# Patient Record
Sex: Female | Born: 1970
Health system: Southern US, Community
[De-identification: ages and names within clinical notes are randomized; demographics above are authoritative.]

## PROBLEM LIST (undated history)

## (undated) DIAGNOSIS — M545 Low back pain, unspecified: Secondary | ICD-10-CM

## (undated) DIAGNOSIS — N809 Endometriosis, unspecified: Secondary | ICD-10-CM

## (undated) DIAGNOSIS — F419 Anxiety disorder, unspecified: Secondary | ICD-10-CM

## (undated) DIAGNOSIS — F32A Depression, unspecified: Secondary | ICD-10-CM

## (undated) DIAGNOSIS — G8929 Other chronic pain: Secondary | ICD-10-CM

## (undated) DIAGNOSIS — G43009 Migraine without aura, not intractable, without status migrainosus: Secondary | ICD-10-CM

## (undated) DIAGNOSIS — F329 Major depressive disorder, single episode, unspecified: Secondary | ICD-10-CM

## (undated) DIAGNOSIS — K219 Gastro-esophageal reflux disease without esophagitis: Secondary | ICD-10-CM

## (undated) HISTORY — DX: Other chronic pain: G89.29

## (undated) HISTORY — PX: WISDOM TOOTH EXTRACTION: SHX21

## (undated) HISTORY — DX: Low back pain, unspecified: M54.50

## (undated) HISTORY — DX: Gastro-esophageal reflux disease without esophagitis: K21.9

## (undated) HISTORY — DX: Depression, unspecified: F32.A

## (undated) HISTORY — DX: Endometriosis, unspecified: N80.9

## (undated) HISTORY — DX: Anxiety disorder, unspecified: F41.9

## (undated) HISTORY — DX: Major depressive disorder, single episode, unspecified: F32.9

## (undated) HISTORY — DX: Migraine without aura, not intractable, without status migrainosus: G43.009

## (undated) HISTORY — DX: Low back pain: M54.5

---
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
## 1993-08-13 HISTORY — PX: TMJ ARTHROSCOPY: SHX1067

## 1994-08-13 HISTORY — PX: TMJ ARTHROSCOPY: SHX1067

## 2003-08-14 HISTORY — PX: APPENDECTOMY: SHX54

## 2003-08-14 HISTORY — PX: OTHER SURGICAL HISTORY: SHX169

## 2010-01-24 DIAGNOSIS — M546 Pain in thoracic spine: Secondary | ICD-10-CM | POA: Insufficient documentation

## 2012-10-03 ENCOUNTER — Other Ambulatory Visit: Payer: Self-pay | Admitting: Obstetrics and Gynecology

## 2012-10-03 DIAGNOSIS — Z1231 Encounter for screening mammogram for malignant neoplasm of breast: Secondary | ICD-10-CM

## 2012-10-23 ENCOUNTER — Ambulatory Visit
Admission: RE | Admit: 2012-10-23 | Discharge: 2012-10-23 | Disposition: A | Discharge status: Home / Self Care | Payer: 59 | Source: Ambulatory Visit | Attending: Obstetrics and Gynecology | Admitting: Obstetrics and Gynecology

## 2012-10-23 DIAGNOSIS — Z1231 Encounter for screening mammogram for malignant neoplasm of breast: Secondary | ICD-10-CM

## 2013-07-20 ENCOUNTER — Ambulatory Visit: Payer: Self-pay | Admitting: Obstetrics and Gynecology

## 2013-08-14 ENCOUNTER — Ambulatory Visit: Payer: Self-pay | Admitting: Obstetrics and Gynecology

## 2013-09-16 ENCOUNTER — Encounter: Payer: Self-pay | Admitting: Obstetrics and Gynecology

## 2013-09-30 ENCOUNTER — Ambulatory Visit: Payer: Self-pay | Admitting: Obstetrics and Gynecology

## 2013-10-19 ENCOUNTER — Ambulatory Visit: Payer: Self-pay | Admitting: Obstetrics and Gynecology

## 2013-10-22 ENCOUNTER — Encounter: Payer: Self-pay | Admitting: Obstetrics and Gynecology

## 2013-10-23 ENCOUNTER — Ambulatory Visit (INDEPENDENT_AMBULATORY_CARE_PROVIDER_SITE_OTHER): Payer: 59 | Admitting: Obstetrics and Gynecology

## 2013-10-23 ENCOUNTER — Encounter: Payer: Self-pay | Admitting: Obstetrics and Gynecology

## 2013-10-23 VITALS — BP 100/64 | HR 80 | Resp 18 | Ht 64.5 in | Wt 132.0 lb

## 2013-10-23 DIAGNOSIS — Z0000 Encounter for general adult medical examination without abnormal findings: Secondary | ICD-10-CM

## 2013-10-23 DIAGNOSIS — R5383 Other fatigue: Secondary | ICD-10-CM

## 2013-10-23 DIAGNOSIS — R5381 Other malaise: Secondary | ICD-10-CM

## 2013-10-23 DIAGNOSIS — Z01419 Encounter for gynecological examination (general) (routine) without abnormal findings: Secondary | ICD-10-CM

## 2013-10-23 LAB — POCT URINALYSIS DIPSTICK
Bilirubin, UA: NEGATIVE
Blood, UA: NEGATIVE
Glucose, UA: NEGATIVE
Ketones, UA: NEGATIVE
Leukocytes, UA: NEGATIVE
Nitrite, UA: NEGATIVE
Protein, UA: NEGATIVE
Urobilinogen, UA: NEGATIVE
pH, UA: 7

## 2013-10-23 LAB — TSH: TSH: 1.059 u[IU]/mL (ref 0.350–4.500)

## 2013-10-23 LAB — COMPREHENSIVE METABOLIC PANEL
ALT: 12 U/L (ref 0–35)
AST: 15 U/L (ref 0–37)
Albumin: 4.2 g/dL (ref 3.5–5.2)
Alkaline Phosphatase: 66 U/L (ref 39–117)
BUN: 12 mg/dL (ref 6–23)
CO2: 32 mEq/L (ref 19–32)
Calcium: 9 mg/dL (ref 8.4–10.5)
Chloride: 101 mEq/L (ref 96–112)
Creat: 0.84 mg/dL (ref 0.50–1.10)
Glucose, Bld: 86 mg/dL (ref 70–99)
Potassium: 4.3 mEq/L (ref 3.5–5.3)
Sodium: 138 mEq/L (ref 135–145)
Total Bilirubin: 0.4 mg/dL (ref 0.2–1.2)
Total Protein: 6.3 g/dL (ref 6.0–8.3)

## 2013-10-23 LAB — CBC
HCT: 40.9 % (ref 36.0–46.0)
Hemoglobin: 14 g/dL (ref 12.0–15.0)
MCH: 30.3 pg (ref 26.0–34.0)
MCHC: 34.2 g/dL (ref 30.0–36.0)
MCV: 88.5 fL (ref 78.0–100.0)
Platelets: 211 10*3/uL (ref 150–400)
RBC: 4.62 MIL/uL (ref 3.87–5.11)
RDW: 13.3 % (ref 11.5–15.5)
WBC: 7.5 10*3/uL (ref 4.0–10.5)

## 2013-10-23 LAB — LIPID PANEL
Cholesterol: 195 mg/dL (ref 0–200)
HDL: 59 mg/dL (ref >39)
LDL Cholesterol: 114 mg/dL — ABNORMAL HIGH (ref 0–99)
Total CHOL/HDL Ratio: 3.3 Ratio
Triglycerides: 112 mg/dL (ref <150)
VLDL: 22 mg/dL (ref 0–40)

## 2013-10-23 LAB — HEMOGLOBIN, FINGERSTICK: Hemoglobin, fingerstick: 14.2 g/dL (ref 12.0–16.0)

## 2013-10-23 NOTE — Progress Notes (Signed)
Patient ID: Whitney Guzman, female   DOB: November 16, 1970, 43 y.o.   MRN: [id number] GYNECOLOGY VISIT  PCP:  None, has not found PCP since moving to [facility] 1.5 years ago  Referring provider:  none   HPI: 43 y.o.   Married  Caucasian  female   G1P1001 with Patient's last menstrual period was 10/11/2013.   here for    Menses were closer together for a short period of time.  No hot flashes.   Sister diagnosed with breast cancer - perimenopausal.  Uncertain if did genetic testing.   Hgb:  14.2 Urine:  Negative  GYNECOLOGIC HISTORY: Patient's last menstrual period was 10/11/2013. Sexually active:  yes Partner preference:  female Contraception:   condoms Menopausal hormone therapy:  none  DES exposure:   none Blood transfusions:   none Sexually transmitted diseases:  none    GYN procedures and prior surgeries:  Sonohystogram, had an exploratory surgery in pregnancy for a ruptured right ovarian endometrioma.  Last mammogram:  10/28/12, WNL at [facility]               Last pap and high risk HPV testing:   2011?,WNL History of abnormal pap smear:  none   OB History   Grav Para Term Preterm Abortions TAB SAB Ect Mult Living   _0 LIFESTYLE: Exercise:  Walking 3 times per week         Tobacco:  none Alcohol: 0-1 per week Drug use:  none  OTHER HEALTH MAINTENANCE: Tetanus/TDap:  07/04/12 Gardisil:  N/A Influenza:  2014 Zostavax:  N/A  Bone density:  never Colonoscopy:  never  Cholesterol check: 3+ years ago  Family History  Problem Relation Age of Onset  . Depression Father   . Anxiety disorder Father   . Anxiety disorder Sister   . Breast cancer Sister 82  . Cancer Sister   . Anxiety disorder Maternal Grandfather   . Colon cancer Maternal Grandmother     There are no active problems to display for this patient.  Past Medical History  Diagnosis Date  . Endometriosis   . Depression   . Chronic lower back pain     Past Surgical History  Procedure  Laterality Date  . Tmj arthroscopy  1996  . Appendectomy  2005  . Strabismus surgery  1975    ALLERGIES: Review of patient's allergies indicates no known allergies.  Current Outpatient Prescriptions  Medication Sig Dispense Refill  . calcium carbonate (OS-CAL) 600 MG TABS tablet Take 600 mg by mouth 2 (two) times daily with a meal.      . escitalopram (LEXAPRO) 20 MG tablet Take 20 mg by mouth daily.      Marland Kitchen lamoTRIgine (LAMICTAL) 100 MG tablet Take 100 mg by mouth daily.      Marland Kitchen loratadine (CLARITIN) 10 MG tablet Take 10 mg by mouth as needed for allergies.      . modafinil (PROVIGIL) 100 MG tablet Take 100 mg by mouth daily.      . Multiple Vitamin (MULTIVITAMIN) capsule Take 1 capsule by mouth daily.      Marland Kitchen zolpidem (AMBIEN) 10 MG tablet Take 10 mg by mouth at bedtime as needed for sleep.       No current facility-administered medications for this visit.     ROS:  Pertinent items are noted in HPI.  SOCIAL HISTORY:  Mental health counselor.  Husband is a Pension scheme manager  at the [employer]. They have a son.   PHYSICAL EXAMINATION:    BP 100/64  Pulse 80  Resp 18  Ht 5' 4.5" (1.638 m)  Wt 132 lb (59.875 kg)  BMI 22.32 kg/m2  LMP 10/11/2013   Wt Readings from Last 3 Encounters:  10/23/13 132 lb (59.875 kg)     Ht Readings from Last 3 Encounters:  10/23/13 5' 4.5" (1.638 m)    General appearance: alert, cooperative and appears stated age Head: Normocephalic, without obvious abnormality, atraumatic Neck: no adenopathy, supple, symmetrical, trachea midline and thyroid not enlarged, symmetric, no tenderness/mass/nodules Lungs: clear to auscultation bilaterally Breasts: Inspection negative, No nipple retraction or dimpling, No nipple discharge or bleeding, No axillary or supraclavicular adenopathy, Normal to palpation without dominant masses Heart: regular rate and rhythm Abdomen: soft, non-tender; no masses,  no organomegaly Extremities: extremities normal, atraumatic,  no cyanosis or edema Skin: Skin color, texture, turgor normal. No rashes or lesions Lymph nodes: Cervical, supraclavicular, and axillary nodes normal. No abnormal inguinal nodes palpated Neurologic: Grossly normal  Pelvic: External genitalia:  no lesions              Urethra:  normal appearing urethra with no masses, tenderness or lesions              Bartholins and Skenes: normal                 Vagina: normal appearing vagina with normal color and discharge, no lesions              Cervix: normal appearance              Pap and high risk HPV testing done: yes.            Bimanual Exam:  Uterus:  uterus is normal size, shape, consistency and nontender                                      Adnexa: normal adnexa in size, nontender and no masses                                      Rectovaginal: Confirms                                      Anus:  normal sphincter tone, no lesions  ASSESSMENT  Normal gynecologic exam. History of endometriosis.  Distant family history of colon cancer.  Fatigue.  PLAN  Mammogram recommended yearly.  Pap smear and high risk HPV testing performed. Counseled on self breast exam, Calcium and vitamin D intake, exercise. Chol, CMP, CBC, TSH. IFOB kit. Return annually or prn   An After Visit Summary was printed and given to the patient.

## 2013-10-23 NOTE — Patient Instructions (Signed)

## 2013-10-27 LAB — IPS PAP TEST WITH HPV

## 2013-11-04 ENCOUNTER — Telehealth: Payer: Self-pay | Admitting: Emergency Medicine

## 2013-11-04 NOTE — Telephone Encounter (Signed)
Message copied by Ruie Sendejo, Trellis Paganini on Wed Nov 04, 2013  4:05 PM ------      Message from: [place], [place]: Mon Nov 02, 2013  2:58 PM       Olivia Mackie,             Please contact patient regarding pap.  I am not sure that this information will come through to her in her pap result letter.       - Pap negative for squamous intraepithelial lesion.       - Some inflammation noted (nonspecific finding.)        - Endometrial cells also noted. This can occur in the first half of the menstrual cycle. This does not need further evaluation or treatment as long as patient is having normal menstrual cycles.  If she has irregular bleeding or really heavy cycles, we will proceed with an office endometrial biopsy to evaluate further.       - High risk HPV testing was negative.            Thanks.            Danella Maiers. ------

## 2013-11-05 NOTE — Telephone Encounter (Signed)
Late Entry for 3/25 1605: Spoke with patient and message from [place] given. Patient states that "I feel like my cycles have always been heavy." Patient denies any changes in her cycles, but that she will monitor and update Dr. Quincy Simmonds if she does notice any changes. Patient states that her cycles are "fairly regular". She will monitor her cycles and call back with any new or changing symptoms.  Patient also reminded to send in stool occult screen.   Routing to provider for final review. Patient agreeable to disposition. Will close encounter

## 2013-12-04 ENCOUNTER — Other Ambulatory Visit: Payer: Self-pay

## 2013-12-04 DIAGNOSIS — Z1231 Encounter for screening mammogram for malignant neoplasm of breast: Secondary | ICD-10-CM

## 2013-12-15 ENCOUNTER — Encounter (INDEPENDENT_AMBULATORY_CARE_PROVIDER_SITE_OTHER): Payer: Self-pay

## 2013-12-15 ENCOUNTER — Ambulatory Visit
Admission: RE | Admit: 2013-12-15 | Discharge: 2013-12-15 | Disposition: A | Discharge status: Home / Self Care | Payer: 59 | Source: Ambulatory Visit

## 2013-12-15 DIAGNOSIS — Z1231 Encounter for screening mammogram for malignant neoplasm of breast: Secondary | ICD-10-CM

## 2014-05-26 ENCOUNTER — Telehealth: Payer: Self-pay | Admitting: Obstetrics and Gynecology

## 2014-05-26 ENCOUNTER — Encounter: Payer: Self-pay | Admitting: Podiatry

## 2014-05-26 ENCOUNTER — Ambulatory Visit (INDEPENDENT_AMBULATORY_CARE_PROVIDER_SITE_OTHER): Payer: 59 | Admitting: Podiatry

## 2014-05-26 VITALS — BP 101/61 | HR 78 | Resp 16 | Ht 64.0 in | Wt 128.0 lb

## 2014-05-26 DIAGNOSIS — L600 Ingrowing nail: Secondary | ICD-10-CM

## 2014-05-26 NOTE — Patient Instructions (Signed)

## 2014-05-26 NOTE — Progress Notes (Signed)
   Subjective:    Patient ID: Whitney Guzman, female    DOB: 1971/02/16, 43 y.o.   MRN: [id number]  HPI Comments: "I have ingrowns"  Patient c/o tenderness 1st toes bilateral, medial borders, for few weeks. Slightly red. She thinks she trimmed them too close. No home treatment.  Toe Pain       Review of Systems  All other systems reviewed and are negative.      Objective:   Physical Exam        Assessment & Plan:

## 2014-05-26 NOTE — Telephone Encounter (Signed)
LMTCB about canceled appointment with Dr.Silva

## 2014-05-27 NOTE — Progress Notes (Signed)
Subjective:     Patient ID: Whitney Guzman, female   DOB: 08-30-1970, 43 y.o.   MRN: [id number]  Toe Pain    patient states I have developed ingrown toenails on both my big toes and I have tried to trim them and soak them myself and not been able to get the corners to feel right   Review of Systems  All other systems reviewed and are negative.      Objective:   Physical Exam  Nursing note and vitals reviewed. Constitutional: She is oriented to person, place, and time.  Cardiovascular: Intact distal pulses.   Musculoskeletal: Normal range of motion.  Neurological: She is oriented to person, place, and time.  Skin: Skin is warm.   neurovascular status is found to be intact with muscle strength adequate and range of motion subtalar and midtarsal joint within normal limits. Patient is noted to have well-perfused digits is well oriented x3 and is noted to have incurvated nailbeds hallux both the medial border with distal tissue formation and pain when pressed     Assessment:     Ingrown toenail deformity of the medial border hallux both feet    Plan:     H&P and condition discussed. I've recommended removal of the corners and explained the procedure and risk. Patient wants procedure done and today I infiltrated each big toe 60 mg Xylocaine Marcaine mixture removed the medial borders exposed matrix and apply chemical phenol 3 applications 30 seconds followed by alcohol lavaged and sterile dressing. Gave instructions on soaks and reappoint

## 2014-06-14 ENCOUNTER — Encounter: Payer: Self-pay | Admitting: Podiatry

## 2014-10-29 ENCOUNTER — Ambulatory Visit: Payer: 59 | Admitting: Obstetrics and Gynecology

## 2015-04-11 ENCOUNTER — Other Ambulatory Visit: Payer: Self-pay

## 2015-04-11 DIAGNOSIS — Z1231 Encounter for screening mammogram for malignant neoplasm of breast: Secondary | ICD-10-CM

## 2015-05-24 ENCOUNTER — Ambulatory Visit
Admission: RE | Admit: 2015-05-24 | Discharge: 2015-05-24 | Disposition: A | Discharge status: Home / Self Care | Payer: 59 | Source: Ambulatory Visit

## 2015-05-24 DIAGNOSIS — Z1231 Encounter for screening mammogram for malignant neoplasm of breast: Secondary | ICD-10-CM

## 2015-06-03 ENCOUNTER — Ambulatory Visit: Payer: Self-pay | Admitting: Obstetrics and Gynecology

## 2015-10-24 ENCOUNTER — Ambulatory Visit (INDEPENDENT_AMBULATORY_CARE_PROVIDER_SITE_OTHER): Payer: 59

## 2015-10-24 ENCOUNTER — Ambulatory Visit (INDEPENDENT_AMBULATORY_CARE_PROVIDER_SITE_OTHER): Payer: 59 | Admitting: Podiatry

## 2015-10-24 ENCOUNTER — Encounter: Payer: Self-pay | Admitting: Podiatry

## 2015-10-24 VITALS — BP 86/53 | HR 66 | Resp 16

## 2015-10-24 DIAGNOSIS — L600 Ingrowing nail: Secondary | ICD-10-CM | POA: Diagnosis not present

## 2015-10-24 DIAGNOSIS — M79671 Pain in right foot: Secondary | ICD-10-CM | POA: Diagnosis not present

## 2015-10-24 DIAGNOSIS — M722 Plantar fascial fibromatosis: Secondary | ICD-10-CM | POA: Diagnosis not present

## 2015-10-24 DIAGNOSIS — M79672 Pain in left foot: Secondary | ICD-10-CM

## 2015-10-24 DIAGNOSIS — M21619 Bunion of unspecified foot: Secondary | ICD-10-CM | POA: Diagnosis not present

## 2015-10-24 MED ORDER — TRIAMCINOLONE ACETONIDE 10 MG/ML IJ SUSP
10.0000 mg | Freq: Once | INTRAMUSCULAR | Status: AC
  Administered 2015-10-24: 10 mg

## 2015-10-24 NOTE — Patient Instructions (Signed)

## 2015-10-24 NOTE — Progress Notes (Signed)
Subjective:     Patient ID: Darl Pikes, female   DOB: 1971/06/30, 45 y.o.   MRN: [id number]  HPI patient states I been having pain in my left heel for the last couple months and I did have pain along the left bunion site and also is concerned about my ingrown toenail   Review of Systems     Objective:   Physical Exam Neurovascular status intact muscle strength adequate range of motion within normal limits with discomfort in the left plantar heel at the insertional point tendon into the calcaneus with fluid buildup around the medial band and pain when palpated. Also noted to have mild prominence first metatarsal head left and slight scar tissue formation around the left hallux were we had removed previous ingrown toenail which is no longer painful but she was concerned about the appearance    Assessment:     Plantar fasciitis left along with mild bunion deformity and scar tissue formation around the left hallux medial border that is not a problem due to lack of pain    Plan:     H&P and x-rays of left foot reviewed. Injected the left plantar fashion 3 Milligan Kenalog 5 mill grams Xylocaine and applied fascial brace with instructions and instructed on physical therapy reduced activity and shoe gear modifications. Reappoint as symptoms indicate cusp bunion but do not recommend surgery at this current time  X-ray report indicate mild structural bunion deformity left with no plantar spur formation

## 2015-10-26 ENCOUNTER — Encounter: Payer: Self-pay | Admitting: Obstetrics and Gynecology

## 2015-10-26 ENCOUNTER — Ambulatory Visit (INDEPENDENT_AMBULATORY_CARE_PROVIDER_SITE_OTHER): Payer: 59 | Admitting: Obstetrics and Gynecology

## 2015-10-26 VITALS — BP 104/60 | HR 80 | Resp 14 | Ht 65.0 in | Wt 136.0 lb

## 2015-10-26 DIAGNOSIS — N920 Excessive and frequent menstruation with regular cycle: Secondary | ICD-10-CM

## 2015-10-26 DIAGNOSIS — R5383 Other fatigue: Secondary | ICD-10-CM | POA: Diagnosis not present

## 2015-10-26 DIAGNOSIS — Z01419 Encounter for gynecological examination (general) (routine) without abnormal findings: Secondary | ICD-10-CM

## 2015-10-26 DIAGNOSIS — Z0000 Encounter for general adult medical examination without abnormal findings: Secondary | ICD-10-CM

## 2015-10-26 LAB — CBC
HCT: 42.1 % (ref 36.0–46.0)
Hemoglobin: 13.9 g/dL (ref 12.0–15.0)
MCH: 29.7 pg (ref 26.0–34.0)
MCHC: 33 g/dL (ref 30.0–36.0)
MCV: 90 fL (ref 78.0–100.0)
MPV: 11.9 fL (ref 8.6–12.4)
Platelets: 221 10*3/uL (ref 150–400)
RBC: 4.68 MIL/uL (ref 3.87–5.11)
RDW: 13.6 % (ref 11.5–15.5)
WBC: 7.6 10*3/uL (ref 4.0–10.5)

## 2015-10-26 LAB — COMPREHENSIVE METABOLIC PANEL
ALT: 10 U/L (ref 6–29)
AST: 15 U/L (ref 10–35)
Albumin: 4.5 g/dL (ref 3.6–5.1)
Alkaline Phosphatase: 73 U/L (ref 33–115)
BUN: 20 mg/dL (ref 7–25)
CO2: 31 mmol/L (ref 20–31)
Calcium: 9.7 mg/dL (ref 8.6–10.2)
Chloride: 99 mmol/L (ref 98–110)
Creat: 0.84 mg/dL (ref 0.50–1.10)
Glucose, Bld: 89 mg/dL (ref 65–99)
Potassium: 4.4 mmol/L (ref 3.5–5.3)
Sodium: 138 mmol/L (ref 135–146)
Total Bilirubin: 0.4 mg/dL (ref 0.2–1.2)
Total Protein: 6.6 g/dL (ref 6.1–8.1)

## 2015-10-26 NOTE — Patient Instructions (Signed)

## 2015-10-26 NOTE — Progress Notes (Signed)
Patient ID: Whitney Guzman, female   DOB: [birth year], 45 y.o.   MRN: [id number] 45 y.o. G1P1001 MarriedCaucasianF here for annual exam.  She c/o fatigue She has been treated for depression, occasionally needs an adjustment secondary to fatigue. She needs some blood work done and would like it today. Depression is well controlled.  Period Cycle (Days): 28 Period Duration (Days): 5-6 days  Period Pattern: Regular Menstrual Flow: Moderate, Heavy Menstrual Control: Maxi pad, Tampon Menstrual Control Change Freq (Hours): changes pad/tampon every 2 hours on heavy days  Dysmenorrhea: None Cycles have gradually gotten a little heavier. She gets occasional rectal pain with her cycle (h/o endometriosis).  No vasomotor symptoms.   Patient's last menstrual period was 10/14/2015.          Sexually active: Yes.    The current method of family planning is condoms    Exercising: Yes.    walking/weights 2-3 days a week Smoker:  no  Health Maintenance: Pap:  10-23-13 WNL NEG HR HPV History of abnormal Pap:  no MMG:  05-25-15 WNL Colonoscopy:  Never BMD:   Never TDaP:  07-04-12  Gardasil: N/A   reports that she has never smoked. She has never used smokeless tobacco. She reports that she drinks about 0.5 oz of alcohol per week. 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 Son is 21, doing well.   Past Medical History  Diagnosis Date  . Endometriosis   . Depression   . Chronic lower back pain   Back pain has gotten better with time, can flare.   Past Surgical History  Procedure Laterality Date  . Tmj arthroscopy  1996  . Appendectomy and treatment of endometriosis  2005  . Strabismus surgery  1975    Current Outpatient Prescriptions  Medication Sig Dispense Refill  . ALPRAZolam (XANAX) 0.25 MG tablet TK 1-2 TS PO QHS  3  . escitalopram (LEXAPRO) 20 MG tablet Take 20 mg by mouth daily.    Marland Kitchen lamoTRIgine (LAMICTAL) 100 MG tablet Take  100 mg by mouth daily.    . modafinil (PROVIGIL) 100 MG tablet Take 100 mg by mouth daily.    Marland Kitchen zolpidem (AMBIEN) 10 MG tablet Take 10 mg by mouth at bedtime as needed for sleep.     No current facility-administered medications for this visit.    Family History  Problem Relation Age of Onset  . Depression Father   . Anxiety disorder Father   . Anxiety disorder Sister   . Breast cancer Sister 45  . Cancer Sister   . Anxiety disorder Maternal Grandfather   . Colon cancer Maternal Grandmother   Sister was premenopausal with her diagnosis of breast cancer. No other family history, unsure about BRCA testing.   Review of Systems  Constitutional: Positive for fatigue.  HENT: Negative.   Eyes: Negative.   Respiratory: Negative.   Cardiovascular: Negative.   Gastrointestinal: Negative.   Endocrine: Negative.   Genitourinary: Negative.   Musculoskeletal: Negative.   Skin: Negative.   Allergic/Immunologic: Negative.   Neurological: Negative.   Psychiatric/Behavioral: Negative.     Exam:   BP 104/60 mmHg  Pulse 80  Resp 14  Ht _0  (1.651 m)  Wt 136 lb (61.689 kg)  BMI 22.63 kg/m2  LMP 10/14/2015  Weight change: _1 @ Height:   Height: _2  (165.1 cm)  Ht Readings from Last 3 Encounters:  10/26/15 _3  (1.651 m)  05/26/14 _4  (1.626 m)  10/23/13 5' 4.5" (1.638 m)    General appearance: alert, cooperative and appears stated age Head: Normocephalic, without obvious abnormality, atraumatic Neck: no adenopathy, supple, symmetrical, trachea midline and thyroid normal to inspection and palpation Lungs: clear to auscultation bilaterally Breasts: normal appearance, no masses or tenderness, bilateral fibrocystic changes Heart: regular rate and rhythm Abdomen: soft, non-tender; bowel sounds normal; no masses,  no organomegaly Extremities: extremities normal, atraumatic, no cyanosis or edema Skin: Skin color, texture, turgor normal. No rashes or lesions Lymph nodes:  Cervical, supraclavicular, and axillary nodes normal. No abnormal inguinal nodes palpated Neurologic: Grossly normal   Pelvic: External genitalia:  no lesions              Urethra:  normal appearing urethra with no masses, tenderness or lesions              Bartholins and Skenes: normal                 Vagina: normal appearing vagina with normal color and discharge, no lesions              Cervix: no lesions               Bimanual Exam:  Uterus:  normal sized, slightly irregular shape, suspicious for a small myoma. Mobile, not tender.              Adnexa: no mass, fullness, tenderness               Rectovaginal: Confirms               Anus:  normal sphincter tone, no lesions  Chaperone was present for exam.  A:  Well Woman with normal exam  Fatigue  Heavy cycles, slowly increasing over time. Tolerable. Suspect small myoma on exam.  Family history of breast cancer  P:   3D Mammogram in the fall  Recommended monthly self exam  She is getting calcium and vit D  No pap this year  CBC, Ferritin, CMP, TSH, Free T4, vit D  If she is anemic, would recommend sonohysterogram and endometrial biopsy  She will check if her sister has had BRCA testing.

## 2015-10-27 ENCOUNTER — Telehealth: Payer: Self-pay | Admitting: *Deleted

## 2015-10-27 LAB — VITAMIN D 25 HYDROXY (VIT D DEFICIENCY, FRACTURES): Vit D, 25-Hydroxy: 28 ng/mL — ABNORMAL LOW (ref 30–100)

## 2015-10-27 LAB — FERRITIN: Ferritin: 18 ng/mL (ref 10–232)

## 2015-10-27 LAB — THYROID PANEL WITH TSH
Free Thyroxine Index: 2.2 (ref 1.4–3.8)
T3 Uptake: 30 % (ref 22–35)
T4, Total: 7.2 ug/dL (ref 4.5–12.0)
TSH: 1.03 mIU/L

## 2015-10-27 NOTE — Telephone Encounter (Signed)
Spoke with patient -see result note -eh 

## 2015-10-27 NOTE — Telephone Encounter (Signed)
10/27/15 [place] in regards to lab results -eh

## 2015-10-27 NOTE — Telephone Encounter (Signed)
-----   Message from [name], MD sent at 10/27/2015  8:46 AM EDT ----- Please inform the patient that her vit D level was low and she should start taking 1,000 IU a day of vit D. The rest of her blood work was normal. Please send this blood work on to her Teacher, music.

## 2015-11-09 ENCOUNTER — Ambulatory Visit: Payer: 59 | Admitting: Podiatry

## 2016-09-11 DIAGNOSIS — Z23 Encounter for immunization: Secondary | ICD-10-CM | POA: Diagnosis not present

## 2016-09-12 ENCOUNTER — Ambulatory Visit (INDEPENDENT_AMBULATORY_CARE_PROVIDER_SITE_OTHER): Payer: 59 | Admitting: Orthopaedic Surgery

## 2016-09-12 ENCOUNTER — Other Ambulatory Visit (INDEPENDENT_AMBULATORY_CARE_PROVIDER_SITE_OTHER): Payer: Self-pay

## 2016-09-12 ENCOUNTER — Ambulatory Visit (INDEPENDENT_AMBULATORY_CARE_PROVIDER_SITE_OTHER): Payer: 59

## 2016-09-12 DIAGNOSIS — M5442 Lumbago with sciatica, left side: Principal | ICD-10-CM

## 2016-09-12 DIAGNOSIS — G8929 Other chronic pain: Secondary | ICD-10-CM

## 2016-09-12 DIAGNOSIS — M545 Low back pain: Secondary | ICD-10-CM

## 2016-09-12 NOTE — Progress Notes (Signed)
Office Visit Note   Patient: Whitney Guzman           Date of Birth: 1971-02-20           MRN: [id number] Visit Date: 09/12/2016              Requested by: No referring provider defined for this encounter. PCP: No PCP Per Patient   Assessment & Plan: Visit Diagnoses:  1. Low back pain, unspecified back pain laterality, unspecified chronicity, with sciatica presence unspecified     Plan: Given the failure of all forms of conservative treatment and given the fact that her pain is slowly worsening and she developing radicular symptoms and MRI is warranted to assess the disks as well as the facet joints of her back to determine what the next treatment options will be. We will see her back after the MRI is obtained.  Follow-Up Instructions: Return in about 2 weeks (around 09/26/2016).   Orders:  Orders Placed This Encounter  Procedures  . XR Lumbar Spine 2-3 Views   No orders of the defined types were placed in this encounter.     Procedures: No procedures performed   Clinical Data: No additional findings.   Subjective: Chief Complaint  Patient presents with  . Lower Back - Pain    Patient states she has had pain in her lower back for about 7 years. Pretty constant with activity     HPI The patient is someone I've actually known for long period of time but not seen is a patient. I know her family well. She reports a 7 year history of low back pain after an actual event of her back when she was pulling on something very hard and strained her back. Of the seven-year she's been to abundant amounts physical therapy. She's tried rest, ice, heat, chiropractic treatment, physical therapy, anti-inflammatories and activity modification and time. Her pain is gotten to where its attachment affected activities daily living, her quality of life, and her mobility. She's not been able to exercise due to this. She is a thin individual. She denies any change in bowel or bladder function. She  does report occasional numbness in her right foot. But her pain is mainly along the lowest aspect of her lumbar spine. It hurts when she walks for long. Time or sitting for any significant amount of time. Review of Systems She denies any chest pain, shortness of breath, headache, fever chills.  Objective: Vital Signs: There were no vitals taken for this visit.  Physical Exam She is alert and oriented 3 in no acute distress. Ortho Exam She does have tight hamstrings. She exhibits pain with flexion extension lumbar spine and pain is mainly paraspinal muscles around the facet joints at L4-5 and L5-S1 and the sacroiliac joints bilaterally. She does seem to have a positive straight leg raise bilaterally. She has good strength in her bilateral lower extremities and normal sensation today. Her reflexes are hyper but equal bilaterally. Some are pains anymore with extension of her back in flexion. Specialty Comments:  No specialty comments available.  Imaging: Xr Lumbar Spine 2-3 Views  Result Date: 09/12/2016 2 views of her lumbar spine that are standing show a slight degenerative scoliosis of lumbar spine. There are no acute findings. Her disc heights are well maintained. There is no evidence of spondylolisthesis.    PMFS History: There are no active problems to display for this patient.  Past Medical History:  Diagnosis Date  . Chronic lower back  pain   . Depression   . Endometriosis     Family History  Problem Relation Age of Onset  . Depression Father   . Anxiety disorder Father   . Anxiety disorder Sister   . Breast cancer Sister 88  . Cancer Sister   . Anxiety disorder Maternal Grandfather   . Colon cancer Maternal Grandmother     Past Surgical History:  Procedure Laterality Date  . APPENDECTOMY and treatment of endometriosis  2005  . STRABISMUS SURGERY  1975  . TMJ ARTHROSCOPY  1996   Social History   Occupational History  . Not on file.   Social History Main Topics   . Smoking status: Never Smoker  . Smokeless tobacco: Never Used  . Alcohol use 0.5 oz/week    1 drink(s) per week     Comment: occ wine  . Drug use: No  . Sexual activity: Yes    Partners: Male    Birth control/ protection: Condom     Comment: condoms

## 2016-09-19 ENCOUNTER — Other Ambulatory Visit: Payer: 59

## 2016-09-26 ENCOUNTER — Ambulatory Visit (INDEPENDENT_AMBULATORY_CARE_PROVIDER_SITE_OTHER): Payer: 59 | Admitting: Orthopaedic Surgery

## 2016-10-31 ENCOUNTER — Ambulatory Visit: Payer: 59 | Admitting: Obstetrics and Gynecology

## 2016-11-21 ENCOUNTER — Ambulatory Visit (INDEPENDENT_AMBULATORY_CARE_PROVIDER_SITE_OTHER): Payer: 59 | Admitting: Obstetrics and Gynecology

## 2016-11-21 ENCOUNTER — Encounter: Payer: Self-pay | Admitting: Obstetrics and Gynecology

## 2016-11-21 VITALS — BP 88/42 | HR 64 | Resp 14 | Ht 64.5 in | Wt 136.0 lb

## 2016-11-21 DIAGNOSIS — N920 Excessive and frequent menstruation with regular cycle: Secondary | ICD-10-CM

## 2016-11-21 DIAGNOSIS — E559 Vitamin D deficiency, unspecified: Secondary | ICD-10-CM

## 2016-11-21 DIAGNOSIS — Z0000 Encounter for general adult medical examination without abnormal findings: Secondary | ICD-10-CM | POA: Diagnosis not present

## 2016-11-21 DIAGNOSIS — Z803 Family history of malignant neoplasm of breast: Secondary | ICD-10-CM

## 2016-11-21 DIAGNOSIS — G43009 Migraine without aura, not intractable, without status migrainosus: Secondary | ICD-10-CM

## 2016-11-21 DIAGNOSIS — Z124 Encounter for screening for malignant neoplasm of cervix: Secondary | ICD-10-CM | POA: Diagnosis not present

## 2016-11-21 DIAGNOSIS — Z01411 Encounter for gynecological examination (general) (routine) with abnormal findings: Secondary | ICD-10-CM | POA: Diagnosis not present

## 2016-11-21 DIAGNOSIS — N946 Dysmenorrhea, unspecified: Secondary | ICD-10-CM | POA: Diagnosis not present

## 2016-11-21 DIAGNOSIS — N949 Unspecified condition associated with female genital organs and menstrual cycle: Secondary | ICD-10-CM

## 2016-11-21 DIAGNOSIS — Z808 Family history of malignant neoplasm of other organs or systems: Secondary | ICD-10-CM

## 2016-11-21 LAB — COMPREHENSIVE METABOLIC PANEL
ALT: 9 U/L (ref 6–29)
AST: 13 U/L (ref 10–35)
Albumin: 4.2 g/dL (ref 3.6–5.1)
Alkaline Phosphatase: 64 U/L (ref 33–115)
BUN: 18 mg/dL (ref 7–25)
CO2: 25 mmol/L (ref 20–31)
Calcium: 9.4 mg/dL (ref 8.6–10.2)
Chloride: 102 mmol/L (ref 98–110)
Creat: 0.87 mg/dL (ref 0.50–1.10)
Glucose, Bld: 85 mg/dL (ref 65–99)
Potassium: 4.4 mmol/L (ref 3.5–5.3)
Sodium: 139 mmol/L (ref 135–146)
Total Bilirubin: 0.4 mg/dL (ref 0.2–1.2)
Total Protein: 6.3 g/dL (ref 6.1–8.1)

## 2016-11-21 LAB — CBC
HCT: 41.4 % (ref 35.0–45.0)
Hemoglobin: 13.8 g/dL (ref 11.7–15.5)
MCH: 30.3 pg (ref 27.0–33.0)
MCHC: 33.3 g/dL (ref 32.0–36.0)
MCV: 90.8 fL (ref 80.0–100.0)
MPV: 10.8 fL (ref 7.5–12.5)
Platelets: 201 10*3/uL (ref 140–400)
RBC: 4.56 MIL/uL (ref 3.80–5.10)
RDW: 13.6 % (ref 11.0–15.0)
WBC: 6.2 10*3/uL (ref 3.8–10.8)

## 2016-11-21 LAB — LIPID PANEL
Cholesterol: 216 mg/dL — ABNORMAL HIGH (ref <200)
HDL: 66 mg/dL (ref >50)
LDL Cholesterol: 133 mg/dL — ABNORMAL HIGH (ref <100)
Total CHOL/HDL Ratio: 3.3 Ratio (ref <5.0)
Triglycerides: 84 mg/dL (ref <150)
VLDL: 17 mg/dL (ref <30)

## 2016-11-21 LAB — TSH: TSH: 1.26 mIU/L

## 2016-11-21 NOTE — Progress Notes (Signed)
46 y.o. G1P1001 MarriedCaucasianF here for annual exam.   Period Cycle (Days): 28 Period Duration (Days): 7 days  Period Pattern: Regular Menstrual Flow: Moderate, Heavy Menstrual Control: Tampon, Maxi pad Menstrual Control Change Freq (Hours): changes super tampon  every hour  Dysmenorrhea: (!) Severe Dysmenorrhea Symptoms: Headache  Cycles are heavier in the last year. She is wearing a pad as back up.  Worsening migraines mid cycle, just restarted in the last few months. Advil not helping. Doesn't have a primary MD.   Patient's last menstrual period was 11/10/2016.          Sexually active: Yes.    The current method of family planning is condoms every time .    Exercising: Yes.    walking Smoker:  no  Health Maintenance: Pap:  10-23-13 WNL NEG HR HPV History of abnormal Pap:  no MMG:  05-25-15 WNL  Colonoscopy:  Never BMD:   Never TDaP:  07-04-12 Gardasil: N/A   reports that she has never smoked. She has never used smokeless tobacco. She reports that she drinks about 0.5 oz of alcohol per week . She reports that she does not use drugs. 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 Son is 41  Past Medical History:  Diagnosis Date  . Chronic lower back pain   . Depression   . Endometriosis     Past Surgical History:  Procedure Laterality Date  . APPENDECTOMY and treatment of endometriosis  2005  . STRABISMUS SURGERY  1975  . TMJ ARTHROSCOPY  1996    Current Outpatient Prescriptions  Medication Sig Dispense Refill  . ALPRAZolam (XANAX) 0.25 MG tablet TK 1-2 TS PO QHS  3  . escitalopram (LEXAPRO) 20 MG tablet Take 20 mg by mouth daily.    Marland Kitchen lamoTRIgine (LAMICTAL) 100 MG tablet Take 100 mg by mouth daily.    . modafinil (PROVIGIL) 100 MG tablet Take 100 mg by mouth daily.    Marland Kitchen zolpidem (AMBIEN) 10 MG tablet Take 10 mg by mouth at bedtime as needed for sleep.     No current facility-administered medications for this visit.     Family History  Problem  Relation Age of Onset  . Depression Father   . Anxiety disorder Father   . Anxiety disorder Sister   . Breast cancer Sister 64  . Cancer Sister   . Colon cancer Maternal Grandmother   . Anxiety disorder Maternal Grandfather   Father recently died. Metastatic melanoma, renal cancer (ETOH and tob abuse) Sister didn't have BRCA testing.   Review of Systems  Constitutional: Negative.   HENT: Negative.   Eyes: Negative.   Respiratory: Negative.   Cardiovascular: Negative.   Gastrointestinal: Negative.   Endocrine: Negative.   Genitourinary: Negative.   Musculoskeletal: Negative.   Skin: Negative.   Allergic/Immunologic: Negative.   Neurological: Positive for headaches.       Headaches with menstrual cycles  Psychiatric/Behavioral: Negative.     Exam:   BP (!) 88/42 (BP Location: Right Arm, Patient Position: Sitting, Cuff Size: Normal) Comment: normal per patient  Pulse 64   Resp 14   Ht 5' 4.5" (1.638 m)   Wt 136 lb (61.7 kg)   LMP 11/10/2016   BMI 22.98 kg/m   Weight change: @WEIGHTCHANGE @ Height:   Height: 5' 4.5" (163.8 cm)  Ht Readings from Last 3 Encounters:  11/21/16 5' 4.5" (1.638 m)  10/26/15 5' 5"  (1.651 m)  05/26/14 5' 4"  (1.626 m)    General appearance:  alert, cooperative and appears stated age Head: Normocephalic, without obvious abnormality, atraumatic Neck: no adenopathy, supple, symmetrical, trachea midline and thyroid normal to inspection and palpation Lungs: clear to auscultation bilaterally Cardiovascular: regular rate and rhythm Breasts: normal appearance, no masses or tenderness Abdomen: soft, non-tender; bowel sounds normal; no masses,  no organomegaly Extremities: extremities normal, atraumatic, no cyanosis or edema Skin: Skin color, texture, turgor normal. No rashes or lesions Lymph nodes: Cervical, supraclavicular, and axillary nodes normal. No abnormal inguinal nodes palpated Neurologic: Grossly normal   Pelvic: External genitalia:  no  lesions              Urethra:  normal appearing urethra with no masses, tenderness or lesions              Bartholins and Skenes: normal                 Vagina: normal appearing vagina with normal color and discharge, no lesions              Cervix: no lesions               Bimanual Exam:  Uterus:  slightly irregularly shaped, mobile, not tender              Adnexa: fullness in the right adnexa, suspect stool               Rectovaginal: Confirms               Anus:  normal sphincter tone, no lesions  Chaperone was present for exam.  A:  Well Woman with normal exam  H/O vit d def  Menorrhagia, worsened in the last year  Dysmenorrhea  Fullness right adnexa  Family history of breast cancer in her sister  Family history of melanoma  Migraine without aura   P:   Pap with hpv  Screening labs, plus ferritin, TSH and vit D  Mammogram overdue will get 3D  Discussed ultrasound, will set it up  Discussed mirena for contraception,cycle control, dysmenorrhea and h/o endometriosis  Discussed breast self exam  Discussed calcium and vit D intake  F/U with dermatology for regular skin checks  Try Excedrin for her migraines

## 2016-11-21 NOTE — Patient Instructions (Signed)
EXERCISE AND DIET:  We recommended that you start or continue a regular exercise program for good health. Regular exercise means any activity that makes your heart beat faster and makes you sweat.  We recommend exercising at least 30 minutes per day at least 3 days a week, preferably 4 or 5.  We also recommend a diet low in fat and sugar.  Inactivity, poor dietary choices and obesity can cause diabetes, heart attack, stroke, and kidney damage, among others.    ALCOHOL AND SMOKING:  Women should limit their alcohol intake to no more than 7 drinks/beers/glasses of wine (combined, not each!) per week. Moderation of alcohol intake to this level decreases your risk of breast cancer and liver damage. And of course, no recreational drugs are part of a healthy lifestyle.  And absolutely no smoking or even second hand smoke. Most people know smoking can cause heart and lung diseases, but did you know it also contributes to weakening of your bones? Aging of your skin?  Yellowing of your teeth and nails?  CALCIUM AND VITAMIN D:  Adequate intake of calcium and Vitamin D are recommended.  The recommendations for exact amounts of these supplements seem to change often, but generally speaking 600 mg of calcium (either carbonate or citrate) and 800 units of Vitamin D per day seems prudent. Certain women may benefit from higher intake of Vitamin D.  If you are among these women, your doctor will have told you during your visit.    PAP SMEARS:  Pap smears, to check for cervical cancer or precancers,  have traditionally been done yearly, although recent scientific advances have shown that most women can have pap smears less often.  However, every woman still should have a physical exam from her gynecologist every year. It will include a breast check, inspection of the vulva and vagina to check for abnormal growths or skin changes, a visual exam of the cervix, and then an exam to evaluate the size and shape of the uterus and  ovaries.  And after 47 years of age, a rectal exam is indicated to check for rectal cancers. We will also provide age appropriate advice regarding health maintenance, like when you should have certain vaccines, screening for sexually transmitted diseases, bone density testing, colonoscopy, mammograms, etc.   MAMMOGRAMS:  All women over 40 years old should have a yearly mammogram. Many facilities now offer a "3D" mammogram, which may cost around $50 extra out of pocket. If possible,  we recommend you accept the option to have the 3D mammogram performed.  It both reduces the number of women who will be called back for extra views which then turn out to be normal, and it is better than the routine mammogram at detecting truly abnormal areas.    COLONOSCOPY:  Colonoscopy to screen for colon cancer is recommended for all women at age 50.  We know, you hate the idea of the prep.  We agree, BUT, having colon cancer and not knowing it is worse!!  Colon cancer so often starts as a polyp that can be seen and removed at colonscopy, which can quite literally save your life!  And if your first colonoscopy is normal and you have no family history of colon cancer, most women don't have to have it again for 10 years.  Once every ten years, you can do something that may end up saving your life, right?  We will be happy to help you get it scheduled when you are ready.    Be sure to check your insurance coverage so you understand how much it will cost.  It may be covered as a preventative service at no cost, but you should check your particular policy.      Breast Self-Awareness Breast self-awareness means being familiar with how your breasts look and feel. It involves checking your breasts regularly and reporting any changes to your health care provider. Practicing breast self-awareness is important. A change in your breasts can be a sign of a serious medical problem. Being familiar with how your breasts look and feel allows  you to find any problems early, when treatment is more likely to be successful. All women should practice breast self-awareness, including women who have had breast implants. How to do a breast self-exam One way to learn what is normal for your breasts and whether your breasts are changing is to do a breast self-exam. To do a breast self-exam: Look for Changes   1. Remove all the clothing above your waist. 2. Stand in front of a mirror in a room with good lighting. 3. Put your hands on your hips. 4. Push your hands firmly downward. 5. Compare your breasts in the mirror. Look for differences between them (asymmetry), such as:  Differences in shape.  Differences in size.  Puckers, dips, and bumps in one breast and not the other. 6. Look at each breast for changes in your skin, such as:  Redness.  Scaly areas. 7. Look for changes in your nipples, such as:  Discharge.  Bleeding.  Dimpling.  Redness.  A change in position. Feel for Changes   Carefully feel your breasts for lumps and changes. It is best to do this while lying on your back on the floor and again while sitting or standing in the shower or tub with soapy water on your skin. Feel each breast in the following way:  Place the arm on the side of the breast you are examining above your head.  Feel your breast with the other hand.  Start in the nipple area and make  inch (2 cm) overlapping circles to feel your breast. Use the pads of your three middle fingers to do this. Apply light pressure, then medium pressure, then firm pressure. The light pressure will allow you to feel the tissue closest to the skin. The medium pressure will allow you to feel the tissue that is a little deeper. The firm pressure will allow you to feel the tissue close to the ribs.  Continue the overlapping circles, moving downward over the breast until you feel your ribs below your breast.  Move one finger-width toward the center of the body.  Continue to use the  inch (2 cm) overlapping circles to feel your breast as you move slowly up toward your collarbone.  Continue the up and down exam using all three pressures until you reach your armpit. Write Down What You Find   Write down what is normal for each breast and any changes that you find. Keep a written record with breast changes or normal findings for each breast. By writing this information down, you do not need to depend only on memory for size, tenderness, or location. Write down where you are in your menstrual cycle, if you are still menstruating. If you are having trouble noticing differences in your breasts, do not get discouraged. With time you will become more familiar with the variations in your breasts and more comfortable with the exam. How often should I examine my   breasts? Examine your breasts every month. If you are breastfeeding, the best time to examine your breasts is after a feeding or after using a breast pump. If you menstruate, the best time to examine your breasts is 5-7 days after your period is over. During your period, your breasts are lumpier, and it may be more difficult to notice changes. When should I see my health care provider? See your health care provider if you notice:  A change in shape or size of your breasts or nipples.  A change in the skin of your breast or nipples, such as a reddened or scaly area.  Unusual discharge from your nipples.  A lump or thick area that was not there before.  Pain in your breasts.  Anything that concerns you. This information is not intended to replace advice given to you by your health care provider. Make sure you discuss any questions you have with your health care provider. Document Released: 07/30/2005 Document Revised: 01/05/2016 Document Reviewed: 06/19/2015 Elsevier Interactive Patient Education  2017 [employer]  

## 2016-11-22 LAB — VITAMIN D 25 HYDROXY (VIT D DEFICIENCY, FRACTURES): Vit D, 25-Hydroxy: 32 ng/mL (ref 30–100)

## 2016-11-22 LAB — FERRITIN: Ferritin: 13 ng/mL (ref 10–232)

## 2016-11-23 ENCOUNTER — Telehealth: Payer: Self-pay | Admitting: Obstetrics and Gynecology

## 2016-11-23 ENCOUNTER — Other Ambulatory Visit: Payer: Self-pay | Admitting: Obstetrics and Gynecology

## 2016-11-23 DIAGNOSIS — Z1231 Encounter for screening mammogram for malignant neoplasm of breast: Secondary | ICD-10-CM

## 2016-11-23 LAB — IPS PAP TEST WITH HPV

## 2016-11-23 NOTE — Telephone Encounter (Signed)
Called patient to review benefits for a recommended ultrasound. Left Voicemail requesting a call back. °

## 2016-11-23 NOTE — Telephone Encounter (Signed)
Dr. Talbert Nan, ok to keep PUS scheduled for 5/1? Reason for exam: menorrhagia, dysmenorrhea, adnexal fullness.

## 2016-11-23 NOTE — Telephone Encounter (Signed)
Spoke with patient. Reviewed benefits. Patient agreeable and ready to schedule.  Next available date is 12-04-16. Patient unavailable that day and requests appointment following week on 12-11-16.  Patient requests call if necessary to schedule sooner and she will adjust her schedule.   Routing to triage to review scheduling.

## 2016-11-26 NOTE — Telephone Encounter (Signed)
That's fine

## 2016-12-05 DIAGNOSIS — D1801 Hemangioma of skin and subcutaneous tissue: Secondary | ICD-10-CM | POA: Diagnosis not present

## 2016-12-05 DIAGNOSIS — L812 Freckles: Secondary | ICD-10-CM | POA: Diagnosis not present

## 2016-12-05 DIAGNOSIS — L821 Other seborrheic keratosis: Secondary | ICD-10-CM | POA: Diagnosis not present

## 2016-12-10 ENCOUNTER — Telehealth: Payer: Self-pay | Admitting: Obstetrics and Gynecology

## 2016-12-10 NOTE — Telephone Encounter (Signed)
Patient called with questions about her menstrual cycle continuing on it's 4th day. She has an ultrasound tomorrow.

## 2016-12-10 NOTE — Telephone Encounter (Signed)
Spoke with patient. Patient states she is scheduled for PUS on 5/1 and will be on day 5 of cycle, reports bleeding as light, ok to still have PUS? Advised patient ok to keep appointment for PUS as long as she is comfortable having PUS on cycle. Patient will keep PUS as planned. Patient verbalizes understanding and is agreeable.  Routing to provider for final review. Patient is agreeable to disposition. Will close encounter.

## 2016-12-11 ENCOUNTER — Encounter: Payer: Self-pay | Admitting: Obstetrics and Gynecology

## 2016-12-11 ENCOUNTER — Ambulatory Visit (INDEPENDENT_AMBULATORY_CARE_PROVIDER_SITE_OTHER): Payer: 59

## 2016-12-11 ENCOUNTER — Ambulatory Visit (INDEPENDENT_AMBULATORY_CARE_PROVIDER_SITE_OTHER): Payer: 59 | Admitting: Obstetrics and Gynecology

## 2016-12-11 ENCOUNTER — Other Ambulatory Visit: Payer: Self-pay | Admitting: Obstetrics and Gynecology

## 2016-12-11 VITALS — BP 110/60 | HR 80 | Resp 14 | Wt 135.0 lb

## 2016-12-11 DIAGNOSIS — N809 Endometriosis, unspecified: Secondary | ICD-10-CM

## 2016-12-11 DIAGNOSIS — N949 Unspecified condition associated with female genital organs and menstrual cycle: Secondary | ICD-10-CM | POA: Diagnosis not present

## 2016-12-11 DIAGNOSIS — N924 Excessive bleeding in the premenopausal period: Secondary | ICD-10-CM

## 2016-12-11 DIAGNOSIS — F32A Depression, unspecified: Secondary | ICD-10-CM | POA: Insufficient documentation

## 2016-12-11 DIAGNOSIS — F329 Major depressive disorder, single episode, unspecified: Secondary | ICD-10-CM | POA: Insufficient documentation

## 2016-12-11 DIAGNOSIS — N946 Dysmenorrhea, unspecified: Secondary | ICD-10-CM | POA: Diagnosis not present

## 2016-12-11 DIAGNOSIS — G43009 Migraine without aura, not intractable, without status migrainosus: Secondary | ICD-10-CM | POA: Insufficient documentation

## 2016-12-11 DIAGNOSIS — N920 Excessive and frequent menstruation with regular cycle: Secondary | ICD-10-CM | POA: Diagnosis not present

## 2016-12-11 NOTE — Progress Notes (Signed)
GYNECOLOGY  VISIT   HPI: 46 y.o.   Married  Caucasian  female   G1P1001 with Patient's last menstrual period was 12/07/2016.   here for follow up menorrhagia. She is not anemic, normal TSH  GYNECOLOGIC HISTORY: Patient's last menstrual period was 12/07/2016. Contraception:condoms  Menopausal hormone therapy: none         OB History    Gravida Para Term Preterm AB Living   1 1 1     1    SAB TAB Ectopic Multiple Live Births                     There are no active problems to display for this patient.   Past Medical History:  Diagnosis Date  . Chronic lower back pain   . Depression   . Endometriosis     Past Surgical History:  Procedure Laterality Date  . APPENDECTOMY and treatment of endometriosis  2005  . STRABISMUS SURGERY  1975  . TMJ ARTHROSCOPY  1996    Current Outpatient Prescriptions  Medication Sig Dispense Refill  . ALPRAZolam (XANAX) 0.25 MG tablet TK 1-2 TS PO QHS  3  . Armodafinil 150 MG tablet TK 1 T PO QAM  0  . escitalopram (LEXAPRO) 20 MG tablet Take 20 mg by mouth daily.    Marland Kitchen lamoTRIgine (LAMICTAL) 100 MG tablet Take 100 mg by mouth daily.    Marland Kitchen zolpidem (AMBIEN) 10 MG tablet Take 10 mg by mouth at bedtime as needed for sleep.     No current facility-administered medications for this visit.      ALLERGIES: Patient has no known allergies.  Family History  Problem Relation Age of Onset  . Depression Father   . Anxiety disorder Father   . Anxiety disorder Sister   . Breast cancer Sister 33  . Cancer Sister   . Colon cancer Maternal Grandmother   . Anxiety disorder Maternal Grandfather     Social History   Social History  . Marital status: Married    Spouse name: N/A  . Number of children: N/A  . Years of education: N/A   Occupational History  . Not on file.   Social History Main Topics  . Smoking status: Never Smoker  . Smokeless tobacco: Never Used  . Alcohol use 0.5 oz/week    1 drink(s) per week     Comment: occ wine    . Drug use: No  . Sexual activity: Yes    Partners: Male    Birth control/ protection: Condom     Comment: condoms   Other Topics Concern  . Not on file   Social History Narrative  . No narrative on file    Review of Systems  Constitutional: Negative.   HENT:       Cold sx  Eyes: Negative.   Respiratory: Negative.   Cardiovascular: Negative.   Gastrointestinal: Negative.   Genitourinary: Negative.   Musculoskeletal: Negative.   Skin: Negative.   Neurological: Negative.   Endo/Heme/Allergies: Negative.   Psychiatric/Behavioral: Negative.     PHYSICAL EXAMINATION:    BP 110/60 (BP Location: Right Arm, Patient Position: Sitting, Cuff Size: Normal)   Pulse 80   Resp 14   Wt 135 lb (61.2 kg)   LMP 12/07/2016   BMI 22.81 kg/m     General appearance: alert, cooperative and appears stated age  Pelvic: External genitalia:  no lesions  Urethra:  normal appearing urethra with no masses, tenderness or lesions              Bartholins and Skenes: normal                 Vagina: normal appearing vagina with normal color and discharge, no lesions              Cervix: no lesions   Sonohysterogram The procedure and risks of the procedure were reviewed with the patient, consent form was signed. A speculum was placed in the vagina and the cervix was cleansed with hibacleans. The sonohysterogram catheter was inserted into the uterine cavity without difficulty. Saline was infused under direct observation with the ultrasound. No intracavitary defects were noted.The catheter was removed.                  Chaperone was present for exam.  Images reviewed  ASSESSMENT Menorrhagia, no anemia, negative sonohysterogram   PLAN Discussed options of doing nothing, OCP's, using NSAID's, and the mirena IUD She will consider her options She will try NSAID's If her cycles got heavier would recommend a biopsy She will call if she wants a mirena IUD, would schedule on her cycle  (just using condoms for contraception)   An After Visit Summary was printed and given to the patient.  15 minutes face to face time of which over 50% was spent in counseling.

## 2016-12-12 ENCOUNTER — Ambulatory Visit
Admission: RE | Admit: 2016-12-12 | Discharge: 2016-12-12 | Disposition: A | Discharge status: Home / Self Care | Payer: 59 | Source: Ambulatory Visit | Attending: Obstetrics and Gynecology | Admitting: Obstetrics and Gynecology

## 2016-12-12 DIAGNOSIS — Z1231 Encounter for screening mammogram for malignant neoplasm of breast: Secondary | ICD-10-CM | POA: Diagnosis not present

## 2017-08-21 DIAGNOSIS — R42 Dizziness and giddiness: Secondary | ICD-10-CM | POA: Diagnosis not present

## 2017-08-21 DIAGNOSIS — G43909 Migraine, unspecified, not intractable, without status migrainosus: Secondary | ICD-10-CM | POA: Diagnosis not present

## 2017-08-21 DIAGNOSIS — G4700 Insomnia, unspecified: Secondary | ICD-10-CM | POA: Diagnosis not present

## 2017-10-15 DIAGNOSIS — Z0000 Encounter for general adult medical examination without abnormal findings: Secondary | ICD-10-CM | POA: Diagnosis not present

## 2017-10-17 ENCOUNTER — Other Ambulatory Visit: Payer: Self-pay | Admitting: Obstetrics and Gynecology

## 2017-10-21 ENCOUNTER — Other Ambulatory Visit: Payer: Self-pay | Admitting: Family Medicine

## 2017-10-21 DIAGNOSIS — N6320 Unspecified lump in the left breast, unspecified quadrant: Secondary | ICD-10-CM | POA: Diagnosis not present

## 2017-10-21 DIAGNOSIS — Z0000 Encounter for general adult medical examination without abnormal findings: Secondary | ICD-10-CM | POA: Diagnosis not present

## 2017-10-21 DIAGNOSIS — Z6822 Body mass index (BMI) 22.0-22.9, adult: Secondary | ICD-10-CM | POA: Diagnosis not present

## 2017-10-24 ENCOUNTER — Other Ambulatory Visit: Payer: 59

## 2017-10-28 ENCOUNTER — Ambulatory Visit
Admission: RE | Admit: 2017-10-28 | Discharge: 2017-10-28 | Disposition: A | Discharge status: Home / Self Care | Payer: 59 | Source: Ambulatory Visit | Attending: Family Medicine | Admitting: Family Medicine

## 2017-10-28 DIAGNOSIS — N6002 Solitary cyst of left breast: Secondary | ICD-10-CM | POA: Diagnosis not present

## 2017-10-28 DIAGNOSIS — R922 Inconclusive mammogram: Secondary | ICD-10-CM | POA: Diagnosis not present

## 2017-10-28 DIAGNOSIS — N6320 Unspecified lump in the left breast, unspecified quadrant: Secondary | ICD-10-CM

## 2017-11-18 DIAGNOSIS — Z23 Encounter for immunization: Secondary | ICD-10-CM | POA: Diagnosis not present

## 2017-11-19 DIAGNOSIS — D2261 Melanocytic nevi of right upper limb, including shoulder: Secondary | ICD-10-CM | POA: Diagnosis not present

## 2017-11-19 DIAGNOSIS — L814 Other melanin hyperpigmentation: Secondary | ICD-10-CM | POA: Diagnosis not present

## 2017-11-19 DIAGNOSIS — L550 Sunburn of first degree: Secondary | ICD-10-CM | POA: Diagnosis not present

## 2017-11-26 ENCOUNTER — Ambulatory Visit: Payer: 59 | Admitting: Obstetrics and Gynecology

## 2018-01-30 ENCOUNTER — Ambulatory Visit: Payer: 59 | Admitting: Obstetrics and Gynecology

## 2018-02-04 ENCOUNTER — Encounter: Payer: Self-pay | Admitting: Obstetrics and Gynecology

## 2018-02-04 ENCOUNTER — Ambulatory Visit (INDEPENDENT_AMBULATORY_CARE_PROVIDER_SITE_OTHER): Payer: 59 | Admitting: Obstetrics and Gynecology

## 2018-02-04 ENCOUNTER — Other Ambulatory Visit: Payer: Self-pay

## 2018-02-04 VITALS — BP 108/58 | HR 76 | Resp 14 | Ht 64.75 in | Wt 136.0 lb

## 2018-02-04 DIAGNOSIS — L309 Dermatitis, unspecified: Secondary | ICD-10-CM

## 2018-02-04 DIAGNOSIS — N6311 Unspecified lump in the right breast, upper outer quadrant: Secondary | ICD-10-CM

## 2018-02-04 DIAGNOSIS — Z01419 Encounter for gynecological examination (general) (routine) without abnormal findings: Secondary | ICD-10-CM

## 2018-02-04 DIAGNOSIS — Z1211 Encounter for screening for malignant neoplasm of colon: Secondary | ICD-10-CM | POA: Diagnosis not present

## 2018-02-04 MED ORDER — BETAMETHASONE VALERATE 0.1 % EX OINT: TOPICAL_OINTMENT | CUTANEOUS | 0 refills | Status: DC

## 2018-02-04 NOTE — Progress Notes (Signed)
Spoke with Whitney Guzman at [facility]. Patient scheduled while in office for bilateral dx MMG and right breast US, if needed, on 02/06/18 at 2:40pm, arriving at 2:20pm.  Patient verbalizes understanding and is agreeable.

## 2018-02-04 NOTE — Progress Notes (Signed)
47 y.o. G1P1001 MarriedCaucasianF here for annual exam.  She had one cycle that was 2 weeks apart. Typically every 4 weeks. Typically saturating a super + tampon in up to 2-3 hours.   Period Cycle (Days): 28 Period Duration (Days): 5 days  Period Pattern: Regular Menstrual Flow: Moderate Menstrual Control: Tampon, Maxi pad, Thin pad Menstrual Control Change Freq (Hours): changes tampon/pad every 3-4 hours  Dysmenorrhea: None  The patient was evaluated last year for menorrhagia, not anemic, normal TSH, normal sonohysterogram. She was having had diagnostic left breast imaging for a lump in 3/19, breast cysts. Due for her B mammogram. Getting smaller  Patient's last menstrual period was 01/30/2018.          Sexually active: Yes.    The current method of family planning is condoms most of the time.    Exercising: Yes.    walking/ weights Smoker:  no  Health Maintenance: Pap:  11-21-16 WNl NEG Hr HPV 10-23-13 WNL NEG HR HPV  History of abnormal Pap:  Yes- years ago- repeat PAP normal  MMG:  10-28-17 U/S multiple benign left breast cyst- screening MMG recommended  in 2 months  Colonoscopy:  Never BMD:   Never TDaP:  07-04-12 Gardasil: N/A   reports that she has never smoked. She has never used smokeless tobacco. She reports that she drinks alcohol. She reports that she does not use drugs. 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 Son is 42   Past Medical History:  Diagnosis Date  . Chronic lower back pain   . Depression   . Endometriosis   . Migraine without aura     Past Surgical History:  Procedure Laterality Date  . APPENDECTOMY and treatment of endometriosis  2005  . STRABISMUS SURGERY  1975  . TMJ ARTHROSCOPY  1996    Current Outpatient Medications  Medication Sig Dispense Refill  . ALPRAZolam (XANAX) 0.25 MG tablet TK 1-2 TS PO QHS  3  . Armodafinil 150 MG tablet TK 1 T PO QAM  0  . escitalopram (LEXAPRO) 20 MG tablet Take 20 mg by mouth daily.    Marland Kitchen  lamoTRIgine (LAMICTAL) 100 MG tablet Take 100 mg by mouth daily.    Marland Kitchen zolpidem (AMBIEN) 10 MG tablet Take 10 mg by mouth at bedtime as needed for sleep.     No current facility-administered medications for this visit.     Family History  Problem Relation Age of Onset  . Depression Father   . Anxiety disorder Father   . Anxiety disorder Sister   . Breast cancer Sister 8  . Colon cancer Maternal Grandmother   . Cancer Sister 64       melanoma  . Anxiety disorder Maternal Grandfather   one Sister had breast cancer, one with melanoma  Review of Systems  Constitutional: Negative.   HENT: Negative.   Eyes: Negative.   Respiratory: Negative.   Cardiovascular: Negative.   Gastrointestinal: Negative.   Endocrine: Negative.   Genitourinary: Negative.   Musculoskeletal: Negative.   Skin: Negative.   Allergic/Immunologic: Negative.   Neurological: Negative.   Psychiatric/Behavioral: Negative.   Voids frequently, normal amounts. Drinks to mugs of tea a day. No urgency, unless she is very full. No leakage.   Exam:   BP (!) 108/58 (BP Location: Right Arm, Patient Position: Sitting, Cuff Size: Normal)   Pulse 76   Resp 14   Ht 5' 4.75" (1.645 m)   Wt 136 lb (61.7 kg)   LMP  01/30/2018   BMI 22.81 kg/m   Weight change: @WEIGHTCHANGE @ Height:   Height: 5' 4.75" (164.5 cm)  Ht Readings from Last 3 Encounters:  02/04/18 5' 4.75" (1.645 m)  11/21/16 5' 4.5" (1.638 m)  10/26/15 5\' 5"  (1.651 m)    General appearance: alert, cooperative and appears stated age Head: Normocephalic, without obvious abnormality, atraumatic Neck: no adenopathy, supple, symmetrical, trachea midline and thyroid normal to inspection and palpation Lungs: clear to auscultation bilaterally Cardiovascular: regular rate and rhythm Breasts: In the left breast at 4 o'clock in the region of a known cyst is a 1.5 cm cystic feeling lump (smaller). In the right breast at 9-10 o'clock is a 1.5-2 cm smooth mobile lump.   Abdomen: soft, non-tender; non distended,  no masses,  no organomegaly Extremities: extremities normal, atraumatic, no cyanosis or edema Skin: Skin color, texture, turgor normal. No rashes or lesions Lymph nodes: Cervical, supraclavicular, and axillary nodes normal. No abnormal inguinal nodes palpated Neurologic: Grossly normal   Pelvic: External genitalia:  no lesions              Urethra:  normal appearing urethra with no masses, tenderness or lesions              Bartholins and Skenes: normal                 Vagina: normal appearing vagina with normal color and discharge, no lesions              Cervix: no lesions               Bimanual Exam:  Uterus:  normal size, contour, position, consistency, mobility, non-tender              Adnexa: no mass, fullness, tenderness               Rectovaginal: Confirms               Anus:  normal sphincter tone, perianal area with erythema, mild whitening  Chaperone was present for exam.  A:  Well Woman with normal exam  Right breast lump at 9-10 o'clock, suspect cyst  Smaller cyst in the left breast  Perianal dermatitis  P:   Discussed breast self exam  Discussed calcium and vit D intake  Labs with primary (she will send a copy)  IFOB given  Diagnostic breast imaging on the right  Vulvar skin care information given

## 2018-02-04 NOTE — Patient Instructions (Signed)

## 2018-02-06 ENCOUNTER — Ambulatory Visit
Admission: RE | Admit: 2018-02-06 | Discharge: 2018-02-06 | Disposition: A | Discharge status: Home / Self Care | Payer: 59 | Source: Ambulatory Visit | Attending: Obstetrics and Gynecology | Admitting: Obstetrics and Gynecology

## 2018-02-06 DIAGNOSIS — R922 Inconclusive mammogram: Secondary | ICD-10-CM | POA: Diagnosis not present

## 2018-02-06 DIAGNOSIS — N6311 Unspecified lump in the right breast, upper outer quadrant: Secondary | ICD-10-CM

## 2018-02-06 DIAGNOSIS — N6313 Unspecified lump in the right breast, lower outer quadrant: Secondary | ICD-10-CM | POA: Diagnosis not present

## 2018-04-21 DIAGNOSIS — Z23 Encounter for immunization: Secondary | ICD-10-CM | POA: Diagnosis not present

## 2018-05-02 DIAGNOSIS — H5000 Unspecified esotropia: Secondary | ICD-10-CM | POA: Diagnosis not present

## 2018-05-02 DIAGNOSIS — H2513 Age-related nuclear cataract, bilateral: Secondary | ICD-10-CM | POA: Diagnosis not present

## 2018-05-02 DIAGNOSIS — H53032 Strabismic amblyopia, left eye: Secondary | ICD-10-CM | POA: Diagnosis not present

## 2018-12-11 DIAGNOSIS — L570 Actinic keratosis: Secondary | ICD-10-CM | POA: Diagnosis not present

## 2018-12-11 DIAGNOSIS — D2261 Melanocytic nevi of right upper limb, including shoulder: Secondary | ICD-10-CM | POA: Diagnosis not present

## 2018-12-11 DIAGNOSIS — L814 Other melanin hyperpigmentation: Secondary | ICD-10-CM | POA: Diagnosis not present

## 2018-12-11 DIAGNOSIS — L821 Other seborrheic keratosis: Secondary | ICD-10-CM | POA: Diagnosis not present

## 2019-02-12 ENCOUNTER — Other Ambulatory Visit: Payer: Self-pay

## 2019-02-12 ENCOUNTER — Encounter: Payer: Self-pay | Admitting: Obstetrics and Gynecology

## 2019-02-12 ENCOUNTER — Ambulatory Visit (INDEPENDENT_AMBULATORY_CARE_PROVIDER_SITE_OTHER): Payer: 59 | Admitting: Obstetrics and Gynecology

## 2019-02-12 VITALS — BP 104/58 | HR 76 | Temp 98.0°F | Ht 65.0 in | Wt 131.0 lb

## 2019-02-12 DIAGNOSIS — Z0000 Encounter for general adult medical examination without abnormal findings: Secondary | ICD-10-CM

## 2019-02-12 DIAGNOSIS — E559 Vitamin D deficiency, unspecified: Secondary | ICD-10-CM | POA: Diagnosis not present

## 2019-02-12 DIAGNOSIS — Z803 Family history of malignant neoplasm of breast: Secondary | ICD-10-CM

## 2019-02-12 DIAGNOSIS — Z01419 Encounter for gynecological examination (general) (routine) without abnormal findings: Secondary | ICD-10-CM

## 2019-02-12 NOTE — Patient Instructions (Signed)
EXERCISE AND DIET:  We recommended that you start or continue a regular exercise program for good health. Regular exercise means any activity that makes your heart beat faster and makes you sweat.  We recommend exercising at least 30 minutes per day at least 3 days a week, preferably 4 or 5.  We also recommend a diet low in fat and sugar.  Inactivity, poor dietary choices and obesity can cause diabetes, heart attack, stroke, and kidney damage, among others.    ALCOHOL AND SMOKING:  Women should limit their alcohol intake to no more than 7 drinks/beers/glasses of wine (combined, not each!) per week. Moderation of alcohol intake to this level decreases your risk of breast cancer and liver damage. And of course, no recreational drugs are part of a healthy lifestyle.  And absolutely no smoking or even second hand smoke. Most people know smoking can cause heart and lung diseases, but did you know it also contributes to weakening of your bones? Aging of your skin?  Yellowing of your teeth and nails?  CALCIUM AND VITAMIN D:  Adequate intake of calcium and Vitamin D are recommended.  The recommendations for exact amounts of these supplements seem to change often, but generally speaking 1,000 mg of calcium (between diet and supplement) and 800 units of Vitamin D per day seems prudent. Certain women may benefit from higher intake of Vitamin D.  If you are among these women, your doctor will have told you during your visit.    PAP SMEARS:  Pap smears, to check for cervical cancer or precancers,  have traditionally been done yearly, although recent scientific advances have shown that most women can have pap smears less often.  However, every woman still should have a physical exam from her gynecologist every year. It will include a breast check, inspection of the vulva and vagina to check for abnormal growths or skin changes, a visual exam of the cervix, and then an exam to evaluate the size and shape of the uterus and  ovaries.  And after 49 years of age, a rectal exam is indicated to check for rectal cancers. We will also provide age appropriate advice regarding health maintenance, like when you should have certain vaccines, screening for sexually transmitted diseases, bone density testing, colonoscopy, mammograms, etc.   MAMMOGRAMS:  All women over 40 years old should have a yearly mammogram. Many facilities now offer a "3D" mammogram, which may cost around $50 extra out of pocket. If possible,  we recommend you accept the option to have the 3D mammogram performed.  It both reduces the number of women who will be called back for extra views which then turn out to be normal, and it is better than the routine mammogram at detecting truly abnormal areas.    COLON CANCER SCREENING: Now recommend starting at age 45. At this time colonoscopy is not covered for routine screening until 50. There are take home tests that can be done between 45-49.   COLONOSCOPY:  Colonoscopy to screen for colon cancer is recommended for all women at age 50.  We know, you hate the idea of the prep.  We agree, BUT, having colon cancer and not knowing it is worse!!  Colon cancer so often starts as a polyp that can be seen and removed at colonscopy, which can quite literally save your life!  And if your first colonoscopy is normal and you have no family history of colon cancer, most women don't have to have it again for   10 years.  Once every ten years, you can do something that may end up saving your life, right?  We will be happy to help you get it scheduled when you are ready.  Be sure to check your insurance coverage so you understand how much it will cost.  It may be covered as a preventative service at no cost, but you should check your particular policy.      Breast Self-Awareness Breast self-awareness means being familiar with how your breasts look and feel. It involves checking your breasts regularly and reporting any changes to your  health care provider. Practicing breast self-awareness is important. A change in your breasts can be a sign of a serious medical problem. Being familiar with how your breasts look and feel allows you to find any problems early, when treatment is more likely to be successful. All women should practice breast self-awareness, including women who have had breast implants. How to do a breast self-exam One way to learn what is normal for your breasts and whether your breasts are changing is to do a breast self-exam. To do a breast self-exam: Look for Changes  1. Remove all the clothing above your waist. 2. Stand in front of a mirror in a room with good lighting. 3. Put your hands on your hips. 4. Push your hands firmly downward. 5. Compare your breasts in the mirror. Look for differences between them (asymmetry), such as: ? Differences in shape. ? Differences in size. ? Puckers, dips, and bumps in one breast and not the other. 6. Look at each breast for changes in your skin, such as: ? Redness. ? Scaly areas. 7. Look for changes in your nipples, such as: ? Discharge. ? Bleeding. ? Dimpling. ? Redness. ? A change in position. Feel for Changes Carefully feel your breasts for lumps and changes. It is best to do this while lying on your back on the floor and again while sitting or standing in the shower or tub with soapy water on your skin. Feel each breast in the following way:  Place the arm on the side of the breast you are examining above your head.  Feel your breast with the other hand.  Start in the nipple area and make  inch (2 cm) overlapping circles to feel your breast. Use the pads of your three middle fingers to do this. Apply light pressure, then medium pressure, then firm pressure. The light pressure will allow you to feel the tissue closest to the skin. The medium pressure will allow you to feel the tissue that is a little deeper. The firm pressure will allow you to feel the tissue  close to the ribs.  Continue the overlapping circles, moving downward over the breast until you feel your ribs below your breast.  Move one finger-width toward the center of the body. Continue to use the  inch (2 cm) overlapping circles to feel your breast as you move slowly up toward your collarbone.  Continue the up and down exam using all three pressures until you reach your armpit.  Write Down What You Find  Write down what is normal for each breast and any changes that you find. Keep a written record with breast changes or normal findings for each breast. By writing this information down, you do not need to depend only on memory for size, tenderness, or location. Write down where you are in your menstrual cycle, if you are still menstruating. If you are having trouble noticing differences   in your breasts, do not get discouraged. With time you will become more familiar with the variations in your breasts and more comfortable with the exam. How often should I examine my breasts? Examine your breasts every month. If you are breastfeeding, the best time to examine your breasts is after a feeding or after using a breast pump. If you menstruate, the best time to examine your breasts is 5-7 days after your period is over. During your period, your breasts are lumpier, and it may be more difficult to notice changes. When should I see my health care provider? See your health care provider if you notice:  A change in shape or size of your breasts or nipples.  A change in the skin of your breast or nipples, such as a reddened or scaly area.  Unusual discharge from your nipples.  A lump or thick area that was not there before.  Pain in your breasts.  Anything that concerns you.  

## 2019-02-12 NOTE — Progress Notes (Signed)
48 y.o. G38P1001 Married White or Caucasian Not Hispanic or Latino female here for annual exam.  Sexually active, condoms for contraception. No dyspareunia.   H/O breast cysts. The one on the left is larger.   Having menstrual migraines. No perimenopausal symptoms.   Period Cycle (Days): 28 Period Duration (Days): 5-6 days Period Pattern: Regular Menstrual Flow: Heavy Menstrual Control: Tampon, Thin pad Menstrual Control Change Freq (Hours): changes tampon/pad every 2 hours Dysmenorrhea: None  The patient was evaluated a few years ago for menorrhagia, not anemic, normal TSH, normal sonohysterogram.  Patient's last menstrual period was 02/02/2019 (exact date).          Sexually active: Yes.    The current method of family planning is condoms most of the time.    Exercising: Yes.    walking, strength training Smoker:  no  Health Maintenance: Pap:  11/21/2016 WNL NEG HPV, 10-23-13 WNL NEG HR HPV History of abnormal Pap:  no MMG:  02/06/2018 Diagnostic bilateral with right breast ultrasound Birads 2 benign Colonoscopy:  Never BMD:   Never TDaP:  07-04-12 Gardasil: N/A   reports that she has never smoked. She has never used smokeless tobacco. She reports current alcohol use. She reports that she does not use drugs. 2 drinks a month. 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 Son is 32, will be going to [place] next year for voice.   Past Medical History:  Diagnosis Date  . Chronic lower back pain   . Depression   . Endometriosis   . Migraine without aura     Past Surgical History:  Procedure Laterality Date  . APPENDECTOMY and treatment of endometriosis  2005  . STRABISMUS SURGERY  1975  . TMJ ARTHROSCOPY  1996    Current Outpatient Medications  Medication Sig Dispense Refill  . ALPRAZolam (XANAX) 0.25 MG tablet TK 1-2 TS PO QHS  3  . Armodafinil 150 MG tablet TK 1 T PO QAM  0  . escitalopram (LEXAPRO) 20 MG tablet Take 20 mg by mouth daily.    Marland Kitchen lamoTRIgine  (LAMICTAL) 100 MG tablet Take 100 mg by mouth daily.    Marland Kitchen zolpidem (AMBIEN) 10 MG tablet Take 10 mg by mouth at bedtime as needed for sleep.     No current facility-administered medications for this visit.     Family History  Problem Relation Age of Onset  . Depression Father   . Anxiety disorder Father   . Anxiety disorder Sister   . Breast cancer Sister 65  . Colon cancer Maternal Grandmother   . Cancer Sister 37       melanoma  . Anxiety disorder Maternal Grandfather   2 sister. No other family history of breast cancer. Sister with breast cancer was pre-menopausal.   Review of Systems  Constitutional:       Lump in left breast  HENT: Negative.   Eyes: Negative.   Respiratory: Negative.   Cardiovascular: Negative.   Gastrointestinal: Negative.   Endocrine: Negative.   Genitourinary: Negative.   Musculoskeletal: Negative.   Skin: Negative.   Allergic/Immunologic: Negative.   Neurological: Negative.   Hematological: Negative.   Psychiatric/Behavioral: Negative.     Exam:   BP (!) 104/58 (BP Location: Right Arm, Patient Position: Sitting, Cuff Size: Normal)   Pulse 76   Temp 98 F (36.7 C) (Skin)   Ht 5\' 5"  (1.651 m)   Wt 131 lb (59.4 kg)   LMP 02/02/2019 (Exact Date)   BMI 21.80  kg/m   Weight change: @WEIGHTCHANGE @ Height:   Height: 5\' 5"  (165.1 cm)  Ht Readings from Last 3 Encounters:  02/12/19 5\' 5"  (1.651 m)  02/04/18 5' 4.75" (1.645 m)  11/21/16 5' 4.5" (1.638 m)    General appearance: alert, cooperative and appears stated age Head: Normocephalic, without obvious abnormality, atraumatic Neck: no adenopathy, supple, symmetrical, trachea midline and thyroid normal to inspection and palpation Lungs: clear to auscultation bilaterally Cardiovascular: regular rate and rhythm Breasts: normal appearance, no masses or tenderness Abdomen: soft, non-tender; non distended,  no masses,  no organomegaly Extremities: extremities normal, atraumatic, no cyanosis or  edema Skin: Skin color, texture, turgor normal. No rashes or lesions Lymph nodes: Cervical, supraclavicular, and axillary nodes normal. No abnormal inguinal nodes palpated Neurologic: Grossly normal   Pelvic: External genitalia:  no lesions              Urethra:  normal appearing urethra with no masses, tenderness or lesions              Bartholins and Skenes: normal                 Vagina: normal appearing vagina with normal color and discharge, no lesions              Cervix: no lesions               Bimanual Exam:  Uterus:  normal size, contour, position, consistency, mobility, non-tender              Adnexa: no mass, fullness, tenderness               Rectovaginal: Confirms               Anus:  normal sphincter tone, no lesions  Chaperone was present for exam.  A:  Well Woman with normal exam  FH of breast cancer, she will find out more information on recommendations for genetic testing  P:   No pap this year  Screening labs, vit d  She requests to have a blood type  Mammogram due  Discussed breast self exam  Discussed calcium and vit D intake

## 2019-02-16 LAB — COMPREHENSIVE METABOLIC PANEL
ALT: 10 IU/L (ref 0–32)
AST: 17 IU/L (ref 0–40)
Albumin/Globulin Ratio: 2.3 — ABNORMAL HIGH (ref 1.2–2.2)
Albumin: 4.3 g/dL (ref 3.8–4.8)
Alkaline Phosphatase: 68 IU/L (ref 39–117)
BUN/Creatinine Ratio: 18 (ref 9–23)
BUN: 16 mg/dL (ref 6–24)
Bilirubin Total: 0.2 mg/dL (ref 0.0–1.2)
CO2: 29 mmol/L (ref 20–29)
Calcium: 9.7 mg/dL (ref 8.7–10.2)
Chloride: 99 mmol/L (ref 96–106)
Creatinine, Ser: 0.87 mg/dL (ref 0.57–1.00)
GFR calc Af Amer: 90 mL/min/{1.73_m2} (ref >59)
GFR calc non Af Amer: 78 mL/min/{1.73_m2} (ref >59)
Globulin, Total: 1.9 g/dL (ref 1.5–4.5)
Glucose: 87 mg/dL (ref 65–99)
Potassium: 4.6 mmol/L (ref 3.5–5.2)
Sodium: 138 mmol/L (ref 134–144)
Total Protein: 6.2 g/dL (ref 6.0–8.5)

## 2019-02-16 LAB — LIPID PANEL
Chol/HDL Ratio: 3.6 ratio (ref 0.0–4.4)
Cholesterol, Total: 199 mg/dL (ref 100–199)
HDL: 56 mg/dL (ref >39)
LDL Calculated: 122 mg/dL — ABNORMAL HIGH (ref 0–99)
Triglycerides: 105 mg/dL (ref 0–149)
VLDL Cholesterol Cal: 21 mg/dL (ref 5–40)

## 2019-02-16 LAB — CBC
Hematocrit: 42.3 % (ref 34.0–46.6)
Hemoglobin: 13.3 g/dL (ref 11.1–15.9)
MCH: 29.5 pg (ref 26.6–33.0)
MCHC: 31.4 g/dL — ABNORMAL LOW (ref 31.5–35.7)
MCV: 94 fL (ref 79–97)
Platelets: 191 10*3/uL (ref 150–450)
RBC: 4.51 x10E6/uL (ref 3.77–5.28)
RDW: 12.2 % (ref 11.7–15.4)
WBC: 5.2 10*3/uL (ref 3.4–10.8)

## 2019-02-16 LAB — ABO AND RH: Rh Factor: POSITIVE

## 2019-02-16 LAB — VITAMIN D 25 HYDROXY (VIT D DEFICIENCY, FRACTURES): Vit D, 25-Hydroxy: 28.2 ng/mL — ABNORMAL LOW (ref 30.0–100.0)

## 2019-02-25 ENCOUNTER — Other Ambulatory Visit: Payer: Self-pay | Admitting: Obstetrics and Gynecology

## 2019-02-25 DIAGNOSIS — Z1231 Encounter for screening mammogram for malignant neoplasm of breast: Secondary | ICD-10-CM

## 2019-03-06 ENCOUNTER — Other Ambulatory Visit: Payer: Self-pay | Admitting: Obstetrics and Gynecology

## 2019-03-06 ENCOUNTER — Other Ambulatory Visit: Payer: Self-pay

## 2019-03-06 ENCOUNTER — Ambulatory Visit
Admission: RE | Admit: 2019-03-06 | Discharge: 2019-03-06 | Disposition: A | Discharge status: Home / Self Care | Payer: 59 | Source: Ambulatory Visit

## 2019-03-06 DIAGNOSIS — N6002 Solitary cyst of left breast: Secondary | ICD-10-CM

## 2019-03-06 DIAGNOSIS — Z1231 Encounter for screening mammogram for malignant neoplasm of breast: Secondary | ICD-10-CM

## 2019-04-02 ENCOUNTER — Ambulatory Visit
Admission: RE | Admit: 2019-04-02 | Discharge: 2019-04-02 | Disposition: A | Discharge status: Home / Self Care | Payer: 59 | Source: Ambulatory Visit | Attending: Obstetrics and Gynecology | Admitting: Obstetrics and Gynecology

## 2019-04-02 ENCOUNTER — Other Ambulatory Visit: Payer: Self-pay

## 2019-04-02 DIAGNOSIS — N6002 Solitary cyst of left breast: Secondary | ICD-10-CM

## 2019-10-23 ENCOUNTER — Ambulatory Visit: Payer: Self-pay | Attending: Internal Medicine

## 2019-10-23 DIAGNOSIS — Z23 Encounter for immunization: Secondary | ICD-10-CM

## 2019-10-23 NOTE — Progress Notes (Signed)
   Covid-19 Vaccination Clinic  Name:  Janell Wiedemann    MRN: [id number] DOB: [birth year]  10/23/2019  Ms. Bergeron was observed post Covid-19 immunization for 15 minutes without incident. She was provided with Vaccine Information Sheet and instruction to access the V-Safe system.   Ms. Bannon was instructed to call 911 with any severe reactions post vaccine: Marland Kitchen Difficulty breathing  . Swelling of face and throat  . A fast heartbeat  . A bad rash all over body  . Dizziness and weakness   Immunizations Administered    Name Date Dose VIS Date Route   Moderna COVID-19 Vaccine 10/23/2019 11:10 AM 0.5 mL 07/14/2019 Intramuscular   Manufacturer: Moderna   Lot: [id number]   [id number]

## 2019-11-25 ENCOUNTER — Ambulatory Visit: Payer: Self-pay | Attending: Internal Medicine

## 2019-11-25 DIAGNOSIS — Z23 Encounter for immunization: Secondary | ICD-10-CM

## 2019-11-25 NOTE — Progress Notes (Signed)
   Covid-19 Vaccination Clinic  Name:  Whitney Guzman    MRN: [id number] DOB: 31-Jan-1971  11/25/2019  Whitney Guzman was observed post Covid-19 immunization for 15 minutes without incident. She was provided with Vaccine Information Sheet and instruction to access the V-Safe system.   Whitney Guzman was instructed to call 911 with any severe reactions post vaccine: Marland Kitchen Difficulty breathing  . Swelling of face and throat  . A fast heartbeat  . A bad rash all over body  . Dizziness and weakness   Immunizations Administered    Name Date Dose VIS Date Route   Moderna COVID-19 Vaccine 11/25/2019  1:39 PM 0.5 mL 07/14/2019 Intramuscular   Manufacturer: Moderna   Lot: [id number]   [id number]

## 2020-02-16 NOTE — Progress Notes (Signed)
49 y.o. G43P1001 Married White or Caucasian Not Hispanic or Latino female here for annual exam.  Would like referral for GI for colonoscopy. She would also like Thyroid panel. Free T-3 and Free T4 .send results to  Results  Dr. Chauncy Lean [phone] Phone-[phone]   She has had increase in fatigue.  Period Cycle (Days): 28 Period Duration (Days): 7 Period Pattern: Regular Menstrual Flow: Heavy, Moderate, Light Menstrual Control: Tampon, Maxi pad Menstrual Control Change Freq (Hours): 2 Dysmenorrhea: (!) Moderate Dysmenorrhea Symptoms: Diarrhea, Headache   H/O breast cysts.   H/O menstrual migraines  The patient was evaluated a few years ago for menorrhagia, not anemic, normal TSH, normal sonohysterogram.  She is leaking a little urine when she is sleeping, feels slightly wet. Occasional, mild GSI. Rare urge incontinence, small amounts. No urinary frequency, urgency or pain.  Patient's last menstrual period was 02/02/2019.          Sexually active: Yes.    The current method of family planning is condoms always.    Exercising: Yes.    walking  Smoker:  no  Health Maintenance: Pap:  11/21/2016 WNL NEG HPV, 10-23-13 WNL NEG HR HPV History of abnormal Pap:  no MMG:  04/02/19 ultrasound left breast Bi-rads 2 benign  BMD:   Never  Colonoscopy: Never  TDaP:  07/05/12 Gardasil: NA   reports that she has never smoked. She has never used smokeless tobacco. She reports current alcohol use. She reports that she does not use drugs. 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 Son is 1, going to [place] for voice.   Past Medical History:  Diagnosis Date  . Chronic lower back pain   . Depression   . Endometriosis   . Migraine without aura     Past Surgical History:  Procedure Laterality Date  . APPENDECTOMY and treatment of endometriosis  2005  . STRABISMUS SURGERY  1975  . TMJ ARTHROSCOPY  1996    Current Outpatient Medications  Medication Sig Dispense  Refill  . ALPRAZolam (XANAX) 0.25 MG tablet TK 1-2 TS PO QHS  3  . Armodafinil 150 MG tablet TK 1 T PO QAM  0  . butalbital-acetaminophen-caffeine (FIORICET) 50-325-40 MG tablet Take 1-2 tablets by mouth every 6 (six) hours as needed.    . clotrimazole-betamethasone (LOTRISONE) cream Apply 1 application topically as needed.    Marland Kitchen escitalopram (LEXAPRO) 20 MG tablet Take 20 mg by mouth daily.    Marland Kitchen lamoTRIgine (LAMICTAL) 100 MG tablet Take 100 mg by mouth daily.    Marland Kitchen omeprazole (PRILOSEC) 20 MG capsule Take 20 mg by mouth 2 (two) times daily.    Marland Kitchen zolpidem (AMBIEN) 10 MG tablet Take 10 mg by mouth at bedtime as needed for sleep.     No current facility-administered medications for this visit.    Family History  Problem Relation Age of Onset  . Depression Father   . Anxiety disorder Father   . Anxiety disorder Sister   . Breast cancer Sister 28  . Colon cancer Maternal Grandmother   . Cancer Sister 48       melanoma  . Anxiety disorder Maternal Grandfather   Sister diagnosed with breast cancer at 19, didn't have genetic testing, premenopausal.  Other sister had a melanoma.  MGM and MGGM with colon cancer in 70's and 80's  Review of Systems  All other systems reviewed and are negative.   Exam:   BP 108/62   Pulse 96   Ht  5\' 5"  (1.651 m)   Wt 134 lb 12.8 oz (61.1 kg)   LMP 02/02/2019   SpO2 98%   BMI 22.43 kg/m   Weight change: @WEIGHTCHANGE @ Height:   Height: 5\' 5"  (165.1 cm)  Ht Readings from Last 3 Encounters:  02/17/20 5\' 5"  (1.651 m)  02/12/19 5\' 5"  (1.651 m)  02/04/18 5' 4.75" (1.645 m)    General appearance: alert, cooperative and appears stated age Head: Normocephalic, without obvious abnormality, atraumatic Neck: no adenopathy, supple, symmetrical, trachea midline and thyroid normal to inspection and palpation Lungs: clear to auscultation bilaterally Cardiovascular: regular rate and rhythm Breasts: in the right reast at 9-10 o'clock is a smooth, mobile 2.5-3 cm  lump (in region of prior cyst). In the left breast at 4 o'clock just outside the areolar region is a 1-1.5 cm smooth mobile lump (c/w prior cyst) Abdomen: soft, non-tender; non distended,  no masses,  no organomegaly Extremities: extremities normal, atraumatic, no cyanosis or edema Skin: Skin color, texture, turgor normal. No rashes or lesions Lymph nodes: Cervical, supraclavicular, and axillary nodes normal. No abnormal inguinal nodes palpated Neurologic: Grossly normal   Pelvic: External genitalia:  no lesions              Urethra:  normal appearing urethra with no masses, tenderness or lesions              Bartholins and Skenes: normal                 Vagina: normal appearing vagina with normal color and discharge, no lesions              Cervix: no lesions               Bimanual Exam:  Uterus:  normal size, contour, position, consistency, mobility, non-tender              Adnexa: no mass, fullness, tenderness               Rectovaginal: Confirms               Anus:  normal sphincter tone, no lesions  Gae Dry chaperoned for the exam.  A:  Well Woman with normal exam  Fatigue.  FH of breast cancer, in her sister at 67 premenopausal.   FH of melanoma, sister and dad.   H/O breast cysts, large lump in the right breast at 10 o'clock, seen in 2019 mammogram, not mentioned last year. Small lump in the left breast, previously diagnosed cyst.  Mild mixed incontinence, recently with a slight leakage of urine while sleeping  P:   No pap this year  Mammogram in 8/21  Discussed breast self exam  Discussed calcium and vit D intake  Screening labs, TFT's  Yearly skin checks  Referral to genetic counselor placed  Referral for colonoscopy placed  Will ask Radiology to review the films from 2019 and 2020 to see if the right breast seen in 2019 was still present in 2020  Send urine for ua, c&s

## 2020-02-17 ENCOUNTER — Encounter: Payer: Self-pay | Admitting: Obstetrics and Gynecology

## 2020-02-17 ENCOUNTER — Ambulatory Visit (INDEPENDENT_AMBULATORY_CARE_PROVIDER_SITE_OTHER): Payer: 59 | Admitting: Obstetrics and Gynecology

## 2020-02-17 ENCOUNTER — Other Ambulatory Visit: Payer: Self-pay

## 2020-02-17 VITALS — BP 108/62 | HR 96 | Ht 65.0 in | Wt 134.8 lb

## 2020-02-17 DIAGNOSIS — Z0000 Encounter for general adult medical examination without abnormal findings: Secondary | ICD-10-CM

## 2020-02-17 DIAGNOSIS — N3946 Mixed incontinence: Secondary | ICD-10-CM

## 2020-02-17 DIAGNOSIS — R5383 Other fatigue: Secondary | ICD-10-CM

## 2020-02-17 DIAGNOSIS — Z01419 Encounter for gynecological examination (general) (routine) without abnormal findings: Secondary | ICD-10-CM | POA: Diagnosis not present

## 2020-02-17 DIAGNOSIS — Z803 Family history of malignant neoplasm of breast: Secondary | ICD-10-CM

## 2020-02-17 DIAGNOSIS — Z800 Family history of malignant neoplasm of digestive organs: Secondary | ICD-10-CM

## 2020-02-17 DIAGNOSIS — Z1211 Encounter for screening for malignant neoplasm of colon: Secondary | ICD-10-CM

## 2020-02-17 DIAGNOSIS — E559 Vitamin D deficiency, unspecified: Secondary | ICD-10-CM

## 2020-02-17 DIAGNOSIS — Z808 Family history of malignant neoplasm of other organs or systems: Secondary | ICD-10-CM

## 2020-02-17 NOTE — Patient Instructions (Signed)
Perimenopause  Perimenopause is the normal time of life before and after menstrual periods stop completely (menopause). Perimenopause can begin 2-8 years before menopause, and it usually lasts for 1 year after menopause. During perimenopause, the ovaries may or may not produce an egg. What are the causes? This condition is caused by a natural change in hormone levels that happens as you get older. What increases the risk? This condition is more likely to start at an earlier age if you have certain medical conditions or treatments, including:  A tumor of the pituitary gland in the brain.  A disease that affects the ovaries and hormone production.  Radiation treatment for cancer.  Certain cancer treatments, such as chemotherapy or hormone (anti-estrogen) therapy.  Heavy smoking and excessive alcohol use.  Family history of early menopause. What are the signs or symptoms? Perimenopausal changes affect each woman differently. Symptoms of this condition may include:  Hot flashes.  Night sweats.  Irregular menstrual periods.  Decreased sex drive.  Vaginal dryness.  Headaches.  Mood swings.  Depression.  Memory problems or trouble concentrating.  Irritability.  Tiredness.  Weight gain.  Anxiety.  Trouble getting pregnant. How is this diagnosed? This condition is diagnosed based on your medical history, a physical exam, your age, your menstrual history, and your symptoms. Hormone tests may also be done. How is this treated? In some cases, no treatment is needed. You and your health care provider should make a decision together about whether treatment is necessary. Treatment will be based on your individual condition and preferences. Various treatments are available, such as:  Menopausal hormone therapy (MHT).  Medicines to treat specific symptoms.  Acupuncture.  Vitamin or herbal supplements. Before starting treatment, make sure to let your health care provider  know if you have a personal or family history of:  Heart disease.  Breast cancer.  Blood clots.  Diabetes.  Osteoporosis. Follow these instructions at home: Lifestyle  Do not use any products that contain nicotine or tobacco, such as cigarettes and e-cigarettes. If you need help quitting, ask your health care provider.  Eat a balanced diet that includes fresh fruits and vegetables, whole grains, soybeans, eggs, lean meat, and low-fat dairy.  Get at least 30 minutes of physical activity on 5 or more days each week.  Avoid alcoholic and caffeinated beverages, as well as spicy foods. This may help prevent hot flashes.  Get 7-8 hours of sleep each night.  Dress in layers that can be removed to help you manage hot flashes.  Find ways to manage stress, such as deep breathing, meditation, or journaling. General instructions  Keep track of your menstrual periods, including: ? When they occur. ? How heavy they are and how long they last. ? How much time passes between periods.  Keep track of your symptoms, noting when they start, how often you have them, and how long they last.  Take over-the-counter and prescription medicines only as told by your health care provider.  Take vitamin supplements only as told by your health care provider. These may include calcium, vitamin E, and vitamin D.  Use vaginal lubricants or moisturizers to help with vaginal dryness and improve comfort during sex.  Talk with your health care provider before starting any herbal supplements.  Keep all follow-up visits as told by your health care provider. This is important. This includes any group therapy or counseling. Contact a health care provider if:  You have heavy vaginal bleeding or pass blood clots.  Your period   lasts more than 2 days longer than normal.  Your periods are recurring sooner than 21 days.  You bleed after having sex. Get help right away if:  You have chest pain, trouble  breathing, or trouble talking.  You have severe depression.  You have pain when you urinate.  You have severe headaches.  You have vision problems. Summary  Perimenopause is the time when a woman's body begins to move into menopause. This may happen naturally or as a result of other health problems or medical treatments.  Perimenopause can begin 2-8 years before menopause, and it usually lasts for 1 year after menopause.  Perimenopausal symptoms can be managed through medicines, lifestyle changes, and complementary therapies such as acupuncture. This information is not intended to replace advice given to you by your health care provider. Make sure you discuss any questions you have with your health care provider. Document Revised: 07/12/2017 Document Reviewed: 09/04/2016 Elsevier Patient Education  2020 [employer]   EXERCISE AND DIET:  We recommended that you start or continue a regular exercise program for good health. Regular exercise means any activity that makes your heart beat faster and makes you sweat.  We recommend exercising at least 30 minutes per day at least 3 days a week, preferably 4 or 5.  We also recommend a diet low in fat and sugar.  Inactivity, poor dietary choices and obesity can cause diabetes, heart attack, stroke, and kidney damage, among others.    ALCOHOL AND SMOKING:  Women should limit their alcohol intake to no more than 7 drinks/beers/glasses of wine (combined, not each!) per week. Moderation of alcohol intake to this level decreases your risk of breast cancer and liver damage. And of course, no recreational drugs are part of a healthy lifestyle.  And absolutely no smoking or even second hand smoke. Most people know smoking can cause heart and lung diseases, but did you know it also contributes to weakening of your bones? Aging of your skin?  Yellowing of your teeth and nails?  CALCIUM AND VITAMIN D:  Adequate intake of calcium and Vitamin D are recommended.   The recommendations for exact amounts of these supplements seem to change often, but generally speaking 1,000 mg of calcium (between diet and supplement) and 800 units of Vitamin D per day seems prudent. Certain women may benefit from higher intake of Vitamin D.  If you are among these women, your doctor will have told you during your visit.    PAP SMEARS:  Pap smears, to check for cervical cancer or precancers,  have traditionally been done yearly, although recent scientific advances have shown that most women can have pap smears less often.  However, every woman still should have a physical exam from her gynecologist every year. It will include a breast check, inspection of the vulva and vagina to check for abnormal growths or skin changes, a visual exam of the cervix, and then an exam to evaluate the size and shape of the uterus and ovaries.  And after 50 years of age, a rectal exam is indicated to check for rectal cancers. We will also provide age appropriate advice regarding health maintenance, like when you should have certain vaccines, screening for sexually transmitted diseases, bone density testing, colonoscopy, mammograms, etc.   MAMMOGRAMS:  All women over 40 years old should have a yearly mammogram. Many facilities now offer a "3D" mammogram, which may cost around $50 extra out of pocket. If possible,  we recommend you accept the option   to have the 3D mammogram performed.  It both reduces the number of women who will be called back for extra views which then turn out to be normal, and it is better than the routine mammogram at detecting truly abnormal areas.    COLON CANCER SCREENING: Now recommend starting at age 45. At this time colonoscopy is not covered for routine screening until 50. There are take home tests that can be done between 45-49.   COLONOSCOPY:  Colonoscopy to screen for colon cancer is recommended for all women at age 50.  We know, you hate the idea of the prep.  We agree, BUT,  having colon cancer and not knowing it is worse!!  Colon cancer so often starts as a polyp that can be seen and removed at colonscopy, which can quite literally save your life!  And if your first colonoscopy is normal and you have no family history of colon cancer, most women don't have to have it again for 10 years.  Once every ten years, you can do something that may end up saving your life, right?  We will be happy to help you get it scheduled when you are ready.  Be sure to check your insurance coverage so you understand how much it will cost.  It may be covered as a preventative service at no cost, but you should check your particular policy.      Breast Self-Awareness Breast self-awareness means being familiar with how your breasts look and feel. It involves checking your breasts regularly and reporting any changes to your health care provider. Practicing breast self-awareness is important. A change in your breasts can be a sign of a serious medical problem. Being familiar with how your breasts look and feel allows you to find any problems early, when treatment is more likely to be successful. All women should practice breast self-awareness, including women who have had breast implants. How to do a breast self-exam One way to learn what is normal for your breasts and whether your breasts are changing is to do a breast self-exam. To do a breast self-exam: Look for Changes  1. Remove all the clothing above your waist. 2. Stand in front of a mirror in a room with good lighting. 3. Put your hands on your hips. 4. Push your hands firmly downward. 5. Compare your breasts in the mirror. Look for differences between them (asymmetry), such as: ? Differences in shape. ? Differences in size. ? Puckers, dips, and bumps in one breast and not the other. 6. Look at each breast for changes in your skin, such as: ? Redness. ? Scaly areas. 7. Look for changes in your nipples, such  as: ? Discharge. ? Bleeding. ? Dimpling. ? Redness. ? A change in position. Feel for Changes Carefully feel your breasts for lumps and changes. It is best to do this while lying on your back on the floor and again while sitting or standing in the shower or tub with soapy water on your skin. Feel each breast in the following way:  Place the arm on the side of the breast you are examining above your head.  Feel your breast with the other hand.  Start in the nipple area and make  inch (2 cm) overlapping circles to feel your breast. Use the pads of your three middle fingers to do this. Apply light pressure, then medium pressure, then firm pressure. The light pressure will allow you to feel the tissue closest to the skin. The   medium pressure will allow you to feel the tissue that is a little deeper. The firm pressure will allow you to feel the tissue close to the ribs.  Continue the overlapping circles, moving downward over the breast until you feel your ribs below your breast.  Move one finger-width toward the center of the body. Continue to use the  inch (2 cm) overlapping circles to feel your breast as you move slowly up toward your collarbone.  Continue the up and down exam using all three pressures until you reach your armpit.  Write Down What You Find  Write down what is normal for each breast and any changes that you find. Keep a written record with breast changes or normal findings for each breast. By writing this information down, you do not need to depend only on memory for size, tenderness, or location. Write down where you are in your menstrual cycle, if you are still menstruating. If you are having trouble noticing differences in your breasts, do not get discouraged. With time you will become more familiar with the variations in your breasts and more comfortable with the exam. How often should I examine my breasts? Examine your breasts every month. If you are breastfeeding, the  best time to examine your breasts is after a feeding or after using a breast pump. If you menstruate, the best time to examine your breasts is 5-7 days after your period is over. During your period, your breasts are lumpier, and it may be more difficult to notice changes. When should I see my health care provider? See your health care provider if you notice:  A change in shape or size of your breasts or nipples.  A change in the skin of your breast or nipples, such as a reddened or scaly area.  Unusual discharge from your nipples.  A lump or thick area that was not there before.  Pain in your breasts.  Anything that concerns you.  

## 2020-02-18 ENCOUNTER — Telehealth: Payer: Self-pay

## 2020-02-18 LAB — URINALYSIS, MICROSCOPIC ONLY
Bacteria, UA: NONE SEEN
Casts: NONE SEEN /lpf
RBC: NONE SEEN /hpf (ref 0–2)
WBC, UA: NONE SEEN /hpf (ref 0–5)

## 2020-02-18 LAB — THYROID PANEL WITH TSH
Free Thyroxine Index: 1.7 (ref 1.2–4.9)
T3 Uptake Ratio: 28 % (ref 24–39)
T4, Total: 5.9 ug/dL (ref 4.5–12.0)
TSH: 0.959 u[IU]/mL (ref 0.450–4.500)

## 2020-02-18 LAB — LIPID PANEL
Chol/HDL Ratio: 3.8 ratio (ref 0.0–4.4)
Cholesterol, Total: 230 mg/dL — ABNORMAL HIGH (ref 100–199)
HDL: 60 mg/dL (ref >39)
LDL Chol Calc (NIH): 154 mg/dL — ABNORMAL HIGH (ref 0–99)
Triglycerides: 91 mg/dL (ref 0–149)
VLDL Cholesterol Cal: 16 mg/dL (ref 5–40)

## 2020-02-18 LAB — VITAMIN D 25 HYDROXY (VIT D DEFICIENCY, FRACTURES): Vit D, 25-Hydroxy: 23.5 ng/mL — ABNORMAL LOW (ref 30.0–100.0)

## 2020-02-18 LAB — COMPREHENSIVE METABOLIC PANEL
ALT: 10 IU/L (ref 0–32)
AST: 16 IU/L (ref 0–40)
Albumin/Globulin Ratio: 1.8 (ref 1.2–2.2)
Albumin: 4.3 g/dL (ref 3.8–4.8)
Alkaline Phosphatase: 91 IU/L (ref 48–121)
BUN/Creatinine Ratio: 17 (ref 9–23)
BUN: 15 mg/dL (ref 6–24)
Bilirubin Total: 0.2 mg/dL (ref 0.0–1.2)
CO2: 27 mmol/L (ref 20–29)
Calcium: 9 mg/dL (ref 8.7–10.2)
Chloride: 99 mmol/L (ref 96–106)
Creatinine, Ser: 0.89 mg/dL (ref 0.57–1.00)
GFR calc Af Amer: 87 mL/min/{1.73_m2} (ref >59)
GFR calc non Af Amer: 76 mL/min/{1.73_m2} (ref >59)
Globulin, Total: 2.4 g/dL (ref 1.5–4.5)
Glucose: 92 mg/dL (ref 65–99)
Potassium: 4.5 mmol/L (ref 3.5–5.2)
Sodium: 139 mmol/L (ref 134–144)
Total Protein: 6.7 g/dL (ref 6.0–8.5)

## 2020-02-18 LAB — CBC
Hematocrit: 42.3 % (ref 34.0–46.6)
Hemoglobin: 13.9 g/dL (ref 11.1–15.9)
MCH: 30.2 pg (ref 26.6–33.0)
MCHC: 32.9 g/dL (ref 31.5–35.7)
MCV: 92 fL (ref 79–97)
Platelets: 185 10*3/uL (ref 150–450)
RBC: 4.61 x10E6/uL (ref 3.77–5.28)
RDW: 12.2 % (ref 11.7–15.4)
WBC: 4.7 10*3/uL (ref 3.4–10.8)

## 2020-02-18 NOTE — Telephone Encounter (Signed)
-----   Message from [name], MD sent at 02/17/2020  2:21 PM EDT ----- Please ask the Radiologist to compare her mammogram from 2019 and 2020. In 2019 they noted a cyst in the right breast at 10 o'clock, not mentioned last year.  If they didn't see it last year she should have diagnostic imaging, if they did then she can do her screening imaging in 8/21

## 2020-02-18 NOTE — Telephone Encounter (Signed)
Call Radiologist line at [facility] and spoke with Opal Sidles. States will have Dr. Jimmye Norman compare MMG and will return call.

## 2020-02-19 LAB — URINE CULTURE

## 2020-02-19 NOTE — Telephone Encounter (Signed)
-----   Message from [name], MD sent at 02/19/2020 12:59 PM EDT ----- Please let the patient know that the Radiologist did compare the different mammograms and didn't note any significant changes. Her exam from 2 years ago is c/w her exam now. I think it is okay for her to go ahead with her screening mammogram in 8/21, but if she wants diagnostic imaging we can order that.

## 2020-02-19 NOTE — Telephone Encounter (Signed)
Spoke with pt. Pt given results and update from Dr Talbert Nan. Pt states will just do screening MMG that is due in August. Thankful for call back and recommendations.   Routing to Dr Talbert Nan  Encounter closed.

## 2020-02-19 NOTE — Telephone Encounter (Signed)
Left message for pt to return call to triage RN. 

## 2020-03-07 ENCOUNTER — Encounter: Payer: Self-pay | Admitting: Internal Medicine

## 2020-04-12 ENCOUNTER — Other Ambulatory Visit: Payer: Self-pay | Admitting: Obstetrics and Gynecology

## 2020-04-12 DIAGNOSIS — Z1231 Encounter for screening mammogram for malignant neoplasm of breast: Secondary | ICD-10-CM

## 2020-04-20 ENCOUNTER — Other Ambulatory Visit: Payer: Self-pay

## 2020-04-20 ENCOUNTER — Ambulatory Visit
Admission: RE | Admit: 2020-04-20 | Discharge: 2020-04-20 | Disposition: A | Discharge status: Home / Self Care | Payer: 59 | Source: Ambulatory Visit | Attending: Obstetrics and Gynecology | Admitting: Obstetrics and Gynecology

## 2020-04-20 DIAGNOSIS — Z1231 Encounter for screening mammogram for malignant neoplasm of breast: Secondary | ICD-10-CM

## 2020-04-22 ENCOUNTER — Other Ambulatory Visit: Payer: Self-pay | Admitting: Obstetrics and Gynecology

## 2020-04-22 DIAGNOSIS — R928 Other abnormal and inconclusive findings on diagnostic imaging of breast: Secondary | ICD-10-CM

## 2020-04-24 ENCOUNTER — Telehealth: Payer: Self-pay

## 2020-04-24 NOTE — Telephone Encounter (Signed)
Left message for pt to call back, Need to get procedure rescheduled.

## 2020-05-04 ENCOUNTER — Ambulatory Visit: Payer: 59

## 2020-05-04 ENCOUNTER — Other Ambulatory Visit: Payer: Self-pay

## 2020-05-04 ENCOUNTER — Ambulatory Visit
Admission: RE | Admit: 2020-05-04 | Discharge: 2020-05-04 | Disposition: A | Discharge status: Home / Self Care | Payer: 59 | Source: Ambulatory Visit | Attending: Obstetrics and Gynecology | Admitting: Obstetrics and Gynecology

## 2020-05-04 DIAGNOSIS — R928 Other abnormal and inconclusive findings on diagnostic imaging of breast: Secondary | ICD-10-CM

## 2020-05-11 ENCOUNTER — Encounter: Payer: Self-pay | Admitting: Internal Medicine

## 2020-06-06 ENCOUNTER — Other Ambulatory Visit: Payer: Self-pay

## 2020-06-06 ENCOUNTER — Ambulatory Visit (AMBULATORY_SURGERY_CENTER): Payer: Self-pay

## 2020-06-06 ENCOUNTER — Encounter: Payer: Self-pay | Admitting: Internal Medicine

## 2020-06-06 VITALS — Ht 65.0 in | Wt 140.0 lb

## 2020-06-06 DIAGNOSIS — Z1211 Encounter for screening for malignant neoplasm of colon: Secondary | ICD-10-CM

## 2020-06-06 NOTE — Progress Notes (Signed)
No egg or soy allergy known to patient  No issues with past sedation with any surgeries or procedures No intubation problems in the past  No FH of Malignant Hyperthermia No diet pills per patient No home 02 use per patient  No blood thinners per patient  Pt denies issues with constipation  No A fib or A flutter  EMMI video via MyChart  COVID 19 guidelines implemented in [facility] today with Pt and RN  Coupon given to pt in [facility] today , Code to Pharmacy  COVID vaccines completed on 11/2019 per pt;  Due to the COVID-19 pandemic we are asking patients to follow these guidelines. Please only bring one care partner. Please be aware that your care partner may wait in the car in the parking lot or if they feel like they will be too hot to wait in the car, they may wait in the lobby on the 4th floor. All care partners are required to wear a mask the entire time (we do not have any that we can provide them), they need to practice social distancing, and we will do a Covid check for all patient's and care partners when you arrive. Also we will check their temperature and your temperature. If the care partner waits in their car they need to stay in the parking lot the entire time and we will call them on their cell phone when the patient is ready for discharge so they can bring the car to the front of the building. Also all patient's will need to wear a mask into building.  

## 2020-06-20 ENCOUNTER — Ambulatory Visit (AMBULATORY_SURGERY_CENTER): Payer: 59 | Admitting: Internal Medicine

## 2020-06-20 ENCOUNTER — Other Ambulatory Visit: Payer: Self-pay

## 2020-06-20 ENCOUNTER — Encounter: Payer: Self-pay | Admitting: Internal Medicine

## 2020-06-20 VITALS — BP 108/65 | HR 58 | Temp 97.8°F | Resp 13 | Ht 65.0 in | Wt 140.0 lb

## 2020-06-20 DIAGNOSIS — Z1211 Encounter for screening for malignant neoplasm of colon: Secondary | ICD-10-CM | POA: Diagnosis not present

## 2020-06-20 MED ORDER — SODIUM CHLORIDE 0.9 % IV SOLN: 500.0000 mL | Freq: Once | INTRAVENOUS | Status: DC

## 2020-06-20 NOTE — Progress Notes (Signed)
Pt's states no medical or surgical changes since previsit or office visit. 

## 2020-06-20 NOTE — Op Note (Signed)
[facility] Patient Name: Whitney Guzman Procedure Date: 06/20/2020 2:17 PM MRN: [id number] Endoscopist: Gatha Mayer , MD Age: 49 Referring MD:  Date of Birth: 03-10-1971 Gender: Female Account #: [id number] Procedure:                Colonoscopy Indications:              Screening for colorectal malignant neoplasm, This                            is the patient's first colonoscopy Medicines:                Propofol per Anesthesia, Monitored Anesthesia Care Procedure:                Pre-Anesthesia Assessment:                           - Prior to the procedure, a History and Physical                            was performed, and patient medications and                            allergies were reviewed. The patient's tolerance of                            previous anesthesia was also reviewed. The risks                            and benefits of the procedure and the sedation                            options and risks were discussed with the patient.                            All questions were answered, and informed consent                            was obtained. Prior Anticoagulants: The patient has                            taken no previous anticoagulant or antiplatelet                            agents. ASA Grade Assessment: II - A patient with                            mild systemic disease. After reviewing the risks                            and benefits, the patient was deemed in                            satisfactory condition to undergo the procedure.  After obtaining informed consent, the colonoscope                            was passed under direct vision. Throughout the                            procedure, the patient's blood pressure, pulse, and                            oxygen saturations were monitored continuously. The                            Colonoscope was introduced through the anus and                             advanced to the the cecum, identified by                            appendiceal orifice and ileocecal valve. The                            quality of the bowel preparation was good. The                            colonoscopy was performed without difficulty. The                            patient tolerated the procedure well. The bowel                            preparation used was Miralax via split dose                            instruction. Scope In: 2:29:48 PM Scope Out: 2:53:32 PM Scope Withdrawal Time: 0 hours 15 minutes 13 seconds  Total Procedure Duration: 0 hours 23 minutes 44 seconds  Findings:                 The perianal and digital rectal examinations were                            normal.                           The colon (entire examined portion) appeared normal.                           No additional abnormalities were found on                            retroflexion. Complications:            No immediate complications. Estimated blood loss:                            None. Estimated Blood Loss:     Estimated  blood loss: none. Impression:               - The entire examined colon is normal.                           - No specimens collected. Recommendation:           - Repeat colonoscopy or other appropriate test in                            10 years for screening purposes.                           - Patient has a contact number available for                            emergencies. The signs and symptoms of potential                            delayed complications were discussed with the                            patient. Return to normal activities tomorrow.                            Written discharge instructions were provided to the                            patient.                           - Resume previous diet.                           - Continue present medications. Gatha Mayer, MD 06/20/2020 3:02:18 PM This report has been signed  electronically.

## 2020-06-20 NOTE — Progress Notes (Signed)
A/ox3, pleased with MAC, report to RN 

## 2020-06-20 NOTE — Patient Instructions (Addendum)
The colon was normal, no polyps or cancer seem.  Next routine colonoscopy or other screening test in 10 years - 2031  You do not need to test stool for blood if offered as part of annual physical since you had a screening colonoscopy.  I appreciate the opportunity to care for you. Gatha Mayer, MD, FACG YOU HAD AN ENDOSCOPIC PROCEDURE TODAY AT [facility]:   Refer to the procedure report that was given to you for any specific questions about what was found during the examination.  If the procedure report does not answer your questions, please call your gastroenterologist to clarify.  If you requested that your care partner not be given the details of your procedure findings, then the procedure report has been included in a sealed envelope for you to review at your convenience later.  YOU SHOULD EXPECT: Some feelings of bloating in the abdomen. Passage of more gas than usual.  Walking can help get rid of the air that was put into your GI tract during the procedure and reduce the bloating. If you had a lower endoscopy (such as a colonoscopy or flexible sigmoidoscopy) you may notice spotting of blood in your stool or on the toilet paper. If you underwent a bowel prep for your procedure, you may not have a normal bowel movement for a few days.  Please Note:  You might notice some irritation and congestion in your nose or some drainage.  This is from the oxygen used during your procedure.  There is no need for concern and it should clear up in a day or so.  SYMPTOMS TO REPORT IMMEDIATELY:   Following lower endoscopy (colonoscopy or flexible sigmoidoscopy):  Excessive amounts of blood in the stool  Significant tenderness or worsening of abdominal pains  Swelling of the abdomen that is new, acute  Fever of 100F or higher   For urgent or emergent issues, a gastroenterologist can be reached at any hour by calling [phone]. Do not use MyChart messaging for urgent concerns.     DIET:  We do recommend a small meal at first, but then you may proceed to your regular diet.  Drink plenty of fluids but you should avoid alcoholic beverages for 24 hours.  MEDICATIONS: Continue present medications.  ACTIVITY:  You should plan to take it easy for the rest of today and you should NOT DRIVE or use heavy machinery until tomorrow (because of the sedation medicines used during the test).    FOLLOW UP: Our staff will call the number listed on your records 48-72 hours following your procedure to check on you and address any questions or concerns that you may have regarding the information given to you following your procedure. If we do not reach you, we will leave a message.  We will attempt to reach you two times.  During this call, we will ask if you have developed any symptoms of COVID 19. If you develop any symptoms (ie: fever, flu-like symptoms, shortness of breath, cough etc.) before then, please call [phone].  If you test positive for Covid 19 in the 2 weeks post procedure, please call and report this information to [place].    If any biopsies were taken you will be contacted by phone or by letter within the next 1-3 weeks.  Please call us at [phone] if you have not heard about the biopsies in 3 weeks.   Thank you for allowing [place] to provide for your healthcare needs today.  SIGNATURES/CONFIDENTIALITY: You and/or your care partner have signed paperwork which will be entered into your electronic medical record.  These signatures attest to the fact that that the information above on your After Visit Summary has been reviewed and is understood.  Full responsibility of the confidentiality of this discharge information lies with you and/or your care-partner.

## 2020-06-22 ENCOUNTER — Telehealth: Payer: Self-pay

## 2020-06-22 NOTE — Telephone Encounter (Signed)
  Follow up Call-  Call back number 06/20/2020  Post procedure Call Back phone  # [phone]  Permission to leave phone message Yes  Some recent data might be hidden     Patient questions:  Do you have a fever, pain , or abdominal swelling? No. Pain Score  0 *  Have you tolerated food without any problems? Yes.    Have you been able to return to your normal activities? Yes.    Do you have any questions about your discharge instructions: Diet   No. Medications  No. Follow up visit  No.  Do you have questions or concerns about your Care? No.  Actions: * If pain score is 4 or above: No action needed, pain <4.  1. Have you developed a fever since your procedure? no  2.   Have you had an respiratory symptoms (SOB or cough) since your procedure? no  3.   Have you tested positive for COVID 19 since your procedure no  4.   Have you had any family members/close contacts diagnosed with the COVID 19 since your procedure?  no   If yes to any of these questions please route to Joylene John, RN and Joella Prince, RN

## 2020-08-17 DIAGNOSIS — F4322 Adjustment disorder with anxiety: Secondary | ICD-10-CM | POA: Diagnosis not present

## 2020-08-31 DIAGNOSIS — F4322 Adjustment disorder with anxiety: Secondary | ICD-10-CM | POA: Diagnosis not present

## 2020-09-14 DIAGNOSIS — Z0000 Encounter for general adult medical examination without abnormal findings: Secondary | ICD-10-CM | POA: Diagnosis not present

## 2020-09-14 DIAGNOSIS — E785 Hyperlipidemia, unspecified: Secondary | ICD-10-CM | POA: Diagnosis not present

## 2020-09-21 DIAGNOSIS — Z0000 Encounter for general adult medical examination without abnormal findings: Secondary | ICD-10-CM | POA: Diagnosis not present

## 2020-09-21 DIAGNOSIS — G43009 Migraine without aura, not intractable, without status migrainosus: Secondary | ICD-10-CM | POA: Diagnosis not present

## 2020-09-21 DIAGNOSIS — Z1331 Encounter for screening for depression: Secondary | ICD-10-CM | POA: Diagnosis not present

## 2020-10-19 DIAGNOSIS — G47419 Narcolepsy without cataplexy: Secondary | ICD-10-CM | POA: Diagnosis not present

## 2020-10-19 DIAGNOSIS — F331 Major depressive disorder, recurrent, moderate: Secondary | ICD-10-CM | POA: Diagnosis not present

## 2020-10-19 DIAGNOSIS — F411 Generalized anxiety disorder: Secondary | ICD-10-CM | POA: Diagnosis not present

## 2020-12-12 DIAGNOSIS — L570 Actinic keratosis: Secondary | ICD-10-CM | POA: Diagnosis not present

## 2020-12-12 DIAGNOSIS — D2262 Melanocytic nevi of left upper limb, including shoulder: Secondary | ICD-10-CM | POA: Diagnosis not present

## 2020-12-12 DIAGNOSIS — D1801 Hemangioma of skin and subcutaneous tissue: Secondary | ICD-10-CM | POA: Diagnosis not present

## 2020-12-12 DIAGNOSIS — L738 Other specified follicular disorders: Secondary | ICD-10-CM | POA: Diagnosis not present

## 2020-12-12 DIAGNOSIS — L814 Other melanin hyperpigmentation: Secondary | ICD-10-CM | POA: Diagnosis not present

## 2020-12-28 DIAGNOSIS — F331 Major depressive disorder, recurrent, moderate: Secondary | ICD-10-CM | POA: Diagnosis not present

## 2020-12-28 DIAGNOSIS — F411 Generalized anxiety disorder: Secondary | ICD-10-CM | POA: Diagnosis not present

## 2020-12-28 DIAGNOSIS — G47419 Narcolepsy without cataplexy: Secondary | ICD-10-CM | POA: Diagnosis not present

## 2021-02-16 NOTE — Progress Notes (Signed)
50 y.o. [id number] Married White or Caucasian Not Hispanic or Latino female here for annual exam. She is having some bleeding 2 weeks after her last cycle, normally every month. Sexually active, always using condoms. No dyspareunia. She gets hot at night, no specific vasomotor symptoms.  Period Cycle (Days): 28 Period Duration (Days): 6 Period Pattern: Regular Menstrual Flow: Moderate (2nd day heavier) Menstrual Control: Tampon, Thin pad Menstrual Control Change Freq (Hours): 3 Dysmenorrhea: None Dysmenorrhea Symptoms: Headache  H/O GSI, feels it is a little better, tolerable.   Patient's last menstrual period was 02/18/2021.          Sexually active: Yes.    The current method of family planning is condoms most of the time.    Exercising: Yes.     Walking and trainer  Smoker:  no  Health Maintenance: Pap:   11/21/2016 WNL NEG HPV, 10-23-13 WNL NEG HR HPV History of abnormal Pap:  no MMG:  05/04/20 density C Bi-rads 2 benign  BMD:   none  Colonoscopy: 06/20/20, normal. F/U in 10 years.   TDaP:  07/05/12  Gardasil: NA   reports that she has never smoked. She has never used smokeless tobacco. She reports current alcohol use. She reports that she does not use drugs. Minimal ETOH. 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 Son is 27, going to [employer] for voice, will be a Paramedic.   Past Medical History:  Diagnosis Date   Anxiety    on meds   Chronic lower back pain    Depression    on meds   Endometriosis    GERD (gastroesophageal reflux disease)    hx of   Migraine without aura     Past Surgical History:  Procedure Laterality Date   APPENDECTOMY and treatment of endometriosis  2005   STRABISMUS SURGERY  1975   TMJ ARTHROSCOPY  1995   WISDOM TOOTH EXTRACTION      Current Outpatient Medications  Medication Sig Dispense Refill   ALPRAZolam (XANAX) 0.25 MG tablet TK 1-2 TS PO QHS  3   Armodafinil 150 MG tablet TK 1 T PO QAM  0   butalbital-acetaminophen-caffeine  (FIORICET) 50-325-40 MG tablet Take 1-2 tablets by mouth every 6 (six) hours as needed.     clotrimazole-betamethasone (LOTRISONE) cream Apply 1 application topically as needed.     escitalopram (LEXAPRO) 20 MG tablet Take 20 mg by mouth daily.     ibuprofen (ADVIL) 200 MG tablet Take 200 mg by mouth every 6 (six) hours as needed.     lamoTRIgine (LAMICTAL) 100 MG tablet Take 100 mg by mouth daily.     zolpidem (AMBIEN) 10 MG tablet Take 10 mg by mouth at bedtime as needed for sleep.     Current Facility-Administered Medications  Medication Dose Route Frequency Provider Last Rate Last Admin   0.9 %  sodium chloride infusion  500 mL Intravenous Once Gatha Mayer, MD        Family History  Problem Relation Age of Onset   Depression Father    Anxiety disorder Father    Lung cancer Father 32   Melanoma Father 25   Anxiety disorder Sister    Breast cancer Sister 3   Colon cancer Maternal Grandmother 13   Colon polyps Maternal Grandmother    Cancer Sister 30       melanoma   Anxiety disorder Maternal Grandfather    Esophageal cancer Neg Hx    Stomach cancer Neg Hx  Rectal cancer Neg Hx     Review of Systems  All other systems reviewed and are negative.  Exam:   BP 110/64   Pulse 76   Ht 5' 4.5" (1.638 m)   Wt 138 lb (62.6 kg)   LMP 02/18/2021   SpO2 98%   BMI 23.32 kg/m   Weight change: @WEIGHTCHANGE @ Height:   Height: 5' 4.5" (163.8 cm)  Ht Readings from Last 3 Encounters:  02/20/21 5' 4.5" (1.638 m)  06/20/20 5\' 5"  (1.651 m)  06/06/20 5\' 5"  (1.651 m)    General appearance: alert, cooperative and appears stated age Head: Normocephalic, without obvious abnormality, atraumatic Neck: no adenopathy, supple, symmetrical, trachea midline and thyroid normal to inspection and palpation Lungs: clear to auscultation bilaterally Cardiovascular: regular rate and rhythm Breasts:  bilateral fibrocystic breast changes. In the left breast at 4 o'clock just outside the areolar  region is a 1 x 2.5 cm smooth oval, mobile lump. This is in the location of prior known cyst. N o new findings.  Abdomen: soft, non-tender; non distended,  no masses,  no organomegaly Extremities: extremities normal, atraumatic, no cyanosis or edema Skin: Skin color, texture, turgor normal. No rashes or lesions Lymph nodes: Cervical, supraclavicular, and axillary nodes normal. No abnormal inguinal nodes palpated Neurologic: Grossly normal   Pelvic: External genitalia:  no lesions              Urethra:  normal appearing urethra with no masses, tenderness or lesions              Bartholins and Skenes: normal                 Vagina: normal appearing vagina with normal color and discharge, no lesions              Cervix: no lesions               Bimanual Exam:  Uterus:  normal size, contour, position, consistency, mobility, non-tender              Adnexa: no mass, fullness, tenderness               Rectovaginal: Confirms               Anus:  normal sphincter tone, no lesions  Gae Dry chaperoned for the exam.  1. Well woman exam Discussed calcium and vit D intake  2. Vitamin D deficiency - VITAMIN D 25 Hydroxy (Vit-D Deficiency, Fractures)  3. Laboratory exam ordered as part of routine general medical examination - CBC - Comprehensive metabolic panel - Lipid panel  4. Irregular menstrual cycle She will let me know if her menses continue to be abnormal, only one episode of irregular bleeding - TSH  5. Family history of melanoma She gets skin checks  6. Screening for colon cancer Colonoscopy is UTD, f/u in 10 years  7. Screening for cervical cancer - Cytology - PAP  8. Family history of breast cancer She has a h/o breast cysts.  Discussed breast self awareness

## 2021-02-20 ENCOUNTER — Other Ambulatory Visit (HOSPITAL_COMMUNITY)
Admission: RE | Admit: 2021-02-20 | Discharge: 2021-02-20 | Disposition: A | Discharge status: Home / Self Care | Payer: BC Managed Care – PPO | Source: Ambulatory Visit | Attending: Obstetrics and Gynecology | Admitting: Obstetrics and Gynecology

## 2021-02-20 ENCOUNTER — Other Ambulatory Visit: Payer: Self-pay

## 2021-02-20 ENCOUNTER — Encounter: Payer: Self-pay | Admitting: Obstetrics and Gynecology

## 2021-02-20 ENCOUNTER — Ambulatory Visit: Payer: 59 | Admitting: Obstetrics and Gynecology

## 2021-02-20 ENCOUNTER — Ambulatory Visit (INDEPENDENT_AMBULATORY_CARE_PROVIDER_SITE_OTHER): Payer: BC Managed Care – PPO | Admitting: Obstetrics and Gynecology

## 2021-02-20 VITALS — BP 110/64 | HR 76 | Ht 64.5 in | Wt 138.0 lb

## 2021-02-20 DIAGNOSIS — Z0000 Encounter for general adult medical examination without abnormal findings: Secondary | ICD-10-CM

## 2021-02-20 DIAGNOSIS — Z124 Encounter for screening for malignant neoplasm of cervix: Secondary | ICD-10-CM

## 2021-02-20 DIAGNOSIS — E559 Vitamin D deficiency, unspecified: Secondary | ICD-10-CM | POA: Diagnosis not present

## 2021-02-20 DIAGNOSIS — Z803 Family history of malignant neoplasm of breast: Secondary | ICD-10-CM

## 2021-02-20 DIAGNOSIS — Z808 Family history of malignant neoplasm of other organs or systems: Secondary | ICD-10-CM

## 2021-02-20 DIAGNOSIS — Z01419 Encounter for gynecological examination (general) (routine) without abnormal findings: Secondary | ICD-10-CM | POA: Diagnosis not present

## 2021-02-20 DIAGNOSIS — N926 Irregular menstruation, unspecified: Secondary | ICD-10-CM

## 2021-02-20 DIAGNOSIS — Z1211 Encounter for screening for malignant neoplasm of colon: Secondary | ICD-10-CM

## 2021-02-20 NOTE — Patient Instructions (Signed)

## 2021-02-21 LAB — LIPID PANEL
Cholesterol: 228 mg/dL — ABNORMAL HIGH (ref <200)
HDL: 62 mg/dL (ref >=50)
LDL Cholesterol (Calc): 137 mg/dL (calc) — ABNORMAL HIGH
Non-HDL Cholesterol (Calc): 166 mg/dL (calc) — ABNORMAL HIGH (ref <130)
Total CHOL/HDL Ratio: 3.7 (calc) (ref <5.0)
Triglycerides: 157 mg/dL — ABNORMAL HIGH (ref <150)

## 2021-02-21 LAB — COMPREHENSIVE METABOLIC PANEL
AG Ratio: 2.3 (calc) (ref 1.0–2.5)
ALT: 13 U/L (ref 6–29)
AST: 16 U/L (ref 10–35)
Albumin: 4.4 g/dL (ref 3.6–5.1)
Alkaline phosphatase (APISO): 82 U/L (ref 37–153)
BUN: 16 mg/dL (ref 7–25)
CO2: 32 mmol/L (ref 20–32)
Calcium: 9.3 mg/dL (ref 8.6–10.4)
Chloride: 100 mmol/L (ref 98–110)
Creat: 0.84 mg/dL (ref 0.50–1.03)
Globulin: 1.9 g/dL (calc) (ref 1.9–3.7)
Glucose, Bld: 92 mg/dL (ref 65–99)
Potassium: 4.3 mmol/L (ref 3.5–5.3)
Sodium: 135 mmol/L (ref 135–146)
Total Bilirubin: 0.4 mg/dL (ref 0.2–1.2)
Total Protein: 6.3 g/dL (ref 6.1–8.1)

## 2021-02-21 LAB — CBC
HCT: 39.4 % (ref 35.0–45.0)
Hemoglobin: 13.3 g/dL (ref 11.7–15.5)
MCH: 30.2 pg (ref 27.0–33.0)
MCHC: 33.8 g/dL (ref 32.0–36.0)
MCV: 89.5 fL (ref 80.0–100.0)
MPV: 11.8 fL (ref 7.5–12.5)
Platelets: 193 10*3/uL (ref 140–400)
RBC: 4.4 10*6/uL (ref 3.80–5.10)
RDW: 12.5 % (ref 11.0–15.0)
WBC: 5.6 10*3/uL (ref 3.8–10.8)

## 2021-02-21 LAB — TSH: TSH: 1.34 mIU/L

## 2021-02-21 LAB — VITAMIN D 25 HYDROXY (VIT D DEFICIENCY, FRACTURES): Vit D, 25-Hydroxy: 24 ng/mL — ABNORMAL LOW (ref 30–100)

## 2021-02-23 ENCOUNTER — Encounter: Payer: Self-pay | Admitting: Obstetrics and Gynecology

## 2021-02-23 DIAGNOSIS — F331 Major depressive disorder, recurrent, moderate: Secondary | ICD-10-CM | POA: Diagnosis not present

## 2021-02-23 DIAGNOSIS — G47419 Narcolepsy without cataplexy: Secondary | ICD-10-CM | POA: Diagnosis not present

## 2021-02-23 DIAGNOSIS — F411 Generalized anxiety disorder: Secondary | ICD-10-CM | POA: Diagnosis not present

## 2021-02-24 LAB — CYTOLOGY - PAP
Comment: NEGATIVE
Diagnosis: NEGATIVE
High risk HPV: NEGATIVE

## 2021-03-20 ENCOUNTER — Telehealth: Payer: Self-pay

## 2021-03-20 NOTE — Telephone Encounter (Signed)
Unread My Chart message: "Hi Whitney Guzman, Sorry for the delayed response. I did send recommendations for vit D with your lab results. Your level of vit d deficiency doesn't warrant the high dose weekly vit D supplements. You should be able to get your vit d into a normal range with 1,000 IU a day. Typically people aren't symptomatic from vit D deficiency until levels drop below 10. Your level was 24. I hope this is helpful. Take care, [name]"  I called patient and left message in voice mail that she has unread my Chart message from Dr. Talbert Nan and she can login and read it or call us and we can relay it to her.

## 2021-03-21 NOTE — Telephone Encounter (Signed)
Last read by Darl Pikes at 3:55 PM on 03/20/2021.

## 2021-04-06 DIAGNOSIS — U099 Post covid-19 condition, unspecified: Secondary | ICD-10-CM | POA: Diagnosis not present

## 2021-04-06 DIAGNOSIS — Z1331 Encounter for screening for depression: Secondary | ICD-10-CM | POA: Diagnosis not present

## 2021-04-19 DIAGNOSIS — Z20822 Contact with and (suspected) exposure to covid-19: Secondary | ICD-10-CM | POA: Diagnosis not present

## 2021-05-24 DIAGNOSIS — G47419 Narcolepsy without cataplexy: Secondary | ICD-10-CM | POA: Diagnosis not present

## 2021-05-24 DIAGNOSIS — F331 Major depressive disorder, recurrent, moderate: Secondary | ICD-10-CM | POA: Diagnosis not present

## 2021-05-24 DIAGNOSIS — F411 Generalized anxiety disorder: Secondary | ICD-10-CM | POA: Diagnosis not present

## 2021-07-04 ENCOUNTER — Ambulatory Visit (INDEPENDENT_AMBULATORY_CARE_PROVIDER_SITE_OTHER): Payer: BC Managed Care – PPO | Admitting: Neurology

## 2021-07-04 ENCOUNTER — Other Ambulatory Visit: Payer: Self-pay

## 2021-07-04 ENCOUNTER — Encounter: Payer: Self-pay | Admitting: Neurology

## 2021-07-04 VITALS — BP 103/57 | HR 64 | Ht 64.5 in | Wt 137.0 lb

## 2021-07-04 DIAGNOSIS — G9331 Postviral fatigue syndrome: Secondary | ICD-10-CM | POA: Diagnosis not present

## 2021-07-04 DIAGNOSIS — U099 Post covid-19 condition, unspecified: Secondary | ICD-10-CM

## 2021-07-04 DIAGNOSIS — R41840 Attention and concentration deficit: Secondary | ICD-10-CM | POA: Diagnosis not present

## 2021-07-04 DIAGNOSIS — I950 Idiopathic hypotension: Secondary | ICD-10-CM

## 2021-07-04 DIAGNOSIS — R432 Parageusia: Secondary | ICD-10-CM | POA: Diagnosis not present

## 2021-07-04 DIAGNOSIS — R430 Anosmia: Secondary | ICD-10-CM

## 2021-07-04 DIAGNOSIS — G4721 Circadian rhythm sleep disorder, delayed sleep phase type: Secondary | ICD-10-CM

## 2021-07-04 NOTE — Progress Notes (Signed)
SLEEP MEDICINE CLINIC    Provider:  Larey Seat, MD  Primary Care Physician:  Sueanne Margarita, [place] [address]     Referring Provider: Sueanne Margarita, Do [address] [place],  [place] [address]          Chief Complaint according to patient   Patient presents with:     New Patient (Initial Visit)           HISTORY OF PRESENT ILLNESS:   07-04-2021:  Whitney Guzman is a 50 - year- old Caucasian female patient who is seen here upon a requested SLEEP CONSULTATION per Dr Francesco Sor, DO .  Referral on 07/04/2021 . Chief concern according to patient :  " I have once or twice in my life woken up and felt rested" suspected to have a sleep phase syndrome.   Presents today referred by PCP. Has had hx of sleep complications for a while. She states that she had a SS 16+ yrs ago and was negative. There was potential concern for ? Sleep phase disorder. She uses [place] and xanax for sleep. With that can sleep for 8 hrs with 1 bathroom break. She denied snoring or apnea. Wakes up still not feeling well rested.   I have the pleasure of seeing Whitney Guzman today, a right-handed Caucasian female with a possible sleep disorder.  She  has a past medical history of Allergic to dust and bee stings. Anxiety, Chronic lower back pain, Depression, Endometriosis, GERD (gastroesophageal reflux disease), Hypotension, and Migraine without aura. Chronic headaches, Mental fatigue , foggy. Since April 22 COVID infection, has more cognitive difficulties,  partially loss of smell and taste.  Fever and sick. Low energy.  Worse since COVID. Her husband and father in law are patient's here.  The patient had the first sleep study in the year 2004 at [facility] , with Dr Mammie Russian, and diagnosed with sleep phase syndrome. CBT was initiated she uses a day light box. 10.000 lux.   Set a bedtime. She has been on NUVIGIL in generic form.    Sleep relevant medical history: No Sleep walking, new  onset sleep talking,vivid dreams. Family medical /sleep history: no other biological family member on CPAP with OSA, father had been a sleep walker.    Social history:  Patient is working as a Transport planner, English as a second language teacher.  and lives in a household with husband and one son, one dog.  Tele health- and office hours.  Tobacco use -none .  ETOH use : 3 drinks a months,  Caffeine intake in form of Tea ( hot, in AM 2-3 mugs ) or energy drinks. Regular exercise - none since Covid.19, walks her dog,    Hobbies :piano, reading.       Sleep habits are as follows: The patient's dinner time is between 6.30 and 7 PM. The patient goes to bed at 11 PM  without being tired, she takes 3 mg melatonin at night, and continues to sleep for 3-4 hours and can go back to bed after bathroom break, cool, quiet and dark.  The preferred sleep position is supine, with the support of 1 pillow plus 2 under her knees.   Dreams are reportedly frequent/vivid/ sometimes feeling angry.   8.10  AM is the usual rise time. The patient wakes up with an alarm- 7. 50 AM difficult to rise. .  She reports not feeling refreshed or restored in AM, with symptoms such as rare morning headaches, clenching , TMJ,  and residual fatigue.  Naps are taken infrequently, but she would love to have time to do so- on weekends lasting from 1-2 hours and are more refreshing than nocturnal sleep.    Review of Systems: Out of a complete 14 system review, the patient complains of only the following symptoms, and all other reviewed systems are negative.:  Fatigue, sleepiness , snoring, fragmented sleep, Insomnia with delayed , prolonged sleep latency , anxiety since HS , apprehension about sleep.   Chronic headaches, Mental fatigue , foggy.   Since April 22 COVID infection, has more cognitive difficulties,  partially loss of smell and taste.  Fever and sick. Low energy.  Worse since COVID.    How likely are you to doze in the following  situations: 0 = not likely, 1 = slight chance, 2 = moderate chance, 3 = high chance   Sitting and Reading? Watching Television? Sitting inactive in a public place (theater or meeting)? As a passenger in a car for an hour without a break? Lying down in the afternoon when circumstances permit? Sitting and talking to someone? Sitting quietly after lunch without alcohol? In a car, while stopped for a few minutes in traffic?   Total = 9-10 / 24 points   FSS endorsed at 48/ 63 points.   Social History   Socioeconomic History   Marital status: Married    Spouse name: Not on file   Number of children: Not on file   Years of education: Not on file   Highest education level: Not on file  Occupational History   Not on file  Tobacco Use   Smoking status: Never   Smokeless tobacco: Never  Vaping Use   Vaping Use: Never used  Substance and Sexual Activity   Alcohol use: Yes    Alcohol/week: 0.0 - 1.0 standard drinks    Comment: rarely   Drug use: No   Sexual activity: Yes    Partners: Male    Birth control/protection: Condom    Comment: condoms  Other Topics Concern   Not on file  Social History Narrative   Not on file   Social Determinants of Health   Financial Resource Strain: Not on file  Food Insecurity: Not on file  Transportation Needs: Not on file  Physical Activity: Not on file  Stress: Not on file  Social Connections: Not on file    Family History  Problem Relation Age of Onset   Depression Father    Anxiety disorder Father    Lung cancer Father 53   Melanoma Father 47   Anxiety disorder Sister    Breast cancer Sister 23   Colon cancer Maternal Grandmother 83   Colon polyps Maternal Grandmother    Cancer Sister 52       melanoma   Anxiety disorder Maternal Grandfather    Esophageal cancer Neg Hx    Stomach cancer Neg Hx    Rectal cancer Neg Hx     Past Medical History:  Diagnosis Date   Anxiety    on meds   Chronic lower back pain    Depression     on meds   Endometriosis    GERD (gastroesophageal reflux disease)    hx of   Migraine without aura     Past Surgical History:  Procedure Laterality Date   APPENDECTOMY and treatment of endometriosis  2005   STRABISMUS SURGERY  1975   TMJ ARTHROSCOPY  1995   WISDOM TOOTH EXTRACTION  Current Outpatient Medications on File Prior to Visit  Medication Sig Dispense Refill   ALPRAZolam (XANAX) 0.25 MG tablet TK 1-2 TS PO QHS  3   Armodafinil 200 MG TABS Take 1 tablet by mouth daily.     butalbital-acetaminophen-caffeine (FIORICET) 50-325-40 MG tablet Take 1-2 tablets by mouth every 6 (six) hours as needed.     clotrimazole-betamethasone (LOTRISONE) cream Apply 1 application topically as needed.     escitalopram (LEXAPRO) 20 MG tablet Take 20 mg by mouth daily.     ibuprofen (ADVIL) 200 MG tablet Take 200 mg by mouth every 6 (six) hours as needed.     lamoTRIgine (LAMICTAL) 150 MG tablet Take 150 mg by mouth daily.     zolpidem (AMBIEN) 10 MG tablet Take 10 mg by mouth at bedtime as needed for sleep.     Current Facility-Administered Medications on File Prior to Visit  Medication Dose Route Frequency Provider Last Rate Last Admin   0.9 %  sodium chloride infusion  500 mL Intravenous Once Gatha Mayer, MD        Allergies  Allergen Reactions   Bee Venom     Physical exam:  Today's Vitals   07/04/21 1442  BP: (!) 103/57  Pulse: 64  Weight: 137 lb (62.1 kg)  Height: 5' 4.5" (1.638 m)   Body mass index is 23.15 kg/m.   Wt Readings from Last 3 Encounters:  07/04/21 137 lb (62.1 kg)  02/20/21 138 lb (62.6 kg)  06/20/20 140 lb (63.5 kg)     Ht Readings from Last 3 Encounters:  07/04/21 5' 4.5" (1.638 m)  02/20/21 5' 4.5" (1.638 m)  06/20/20 5\' 5"  (1.651 m)      General: The patient is awake, alert and appears not in acute distress. The patient is well groomed. Head: Normocephalic, atraumatic. Neck is supple. Mallampati 1,  neck circumference:13 inches . Nasal  airflow patent. Loss of taste and smell.  Retrognathia is not  seen.  Dental status: 1995 braces and mandibular surgery . Cardiovascular:  Regular rate and cardiac rhythm by pulse,  without distended neck veins. Respiratory: Lungs are clear to auscultation.  Skin:  Without evidence of ankle edema, or rash. Trunk: The patient's posture is erect.   Neurologic exam : The patient is awake and alert, oriented to place and time.   Memory subjective described as impaired, more distractible sice COVID- zoning out. .  Attention span & concentration ability appears normal.  Speech is fluent,  without  dysarthria, dysphonia or aphasia.  Mood and affect are appropriate.   Cranial nerves: partial loss of smell or taste reported - post covid.   Lemon oil - identified as citrus. Grapefruit - not sure.  Vanilla  extract - identified. Peppermint - identified. Almond oil- not identified Coconut - not identified/ butter on popcorn. Lavender - identified.     Pupils are equal and briskly reactive to light. Funduscopic exam deferred. .  Extraocular movements in vertical and horizontal planes were intact and without nystagmus. No Diplopia. Visual fields by finger perimetry are intact. Hearing was intact to soft voice and finger rubbing.    Facial sensation intact to fine touch.  Facial motor strength is symmetric and tongue and uvula move midline.  Neck ROM : rotation, tilt and flexion extension were normal for age and shoulder shrug was symmetrical.    Motor exam:  Symmetric bulk, tone and ROM.   Normal tone without cog wheeling, symmetric grip strength . Right side tennis  elbow/braces at night- Sensory:  Fine touch, pinprick and vibration were tested  and  normal.  Proprioception tested in the upper extremities was normal. Coordination: Rapid alternating movements in the fingers/hands were of normal speed.  The Finger-to-nose maneuver was intact without evidence of ataxia, dysmetria or tremor.   Gait and station: Patient could rise unassisted from a seated position, walked without assistive device. Stance is of normal width/ base and the patient turned with 3 steps.  Toe and heel walk were deferred.  Deep tendon reflexes: in the  upper and lower extremities are symmetric and intact.  Babinski response was deferred .       After spending a total time of 60 minutes including face to face and additional time for physical and neurologic examination, review of laboratory studies,  personal review of imaging studies, reports and results of other testing and review of referral information / records as far as provided in visit, I have established the following assessments:  1) post Covid fatigue syndrome, prolonged recovery of anosmia ,ageusia.  She can now chew a spicy cinnamon gum which she would not have tolerated before.  Couldn't smell a cake she recently baked for her family when she was congratulated on its delicious smell. 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   2) "covid brain fog "   3) delayed sleep latency for many, many years, partially about apprehension, worried about sleep onset and total sleep time,    My Plan is to proceed with:  1) MRI brain with and without contrast.  Atrophy?   2) sleep phase syndrome , check with HST. On modafinil. No RLS, no GERD.   3) MOCA next visit. Smell test .  Olfactory rehab program .   I would like to thank Sueanne Margarita, DO and Sueanne Margarita, Do [address] [place],  [place] [address] for allowing me to meet with and to take care of this pleasant patient.   In short, [place] is presenting with post covid fatigue , mild cognitive concerns, loss of smell and taste, and has longstanding sleep issues.  I plan to follow up  personally within 3-4 month.   CC: I will share my notes with  PCP. Marland Kitchen  Electronically signed by: Larey Seat, MD 07/04/2021 2:58 PM  [facility] [employer] Board certified by The [employer] of Sleep Medicine and Diplomate of the [employer] of Sleep Medicine. Board certified In Neurology through the [place], Fellow of the [employer] of Neurology. Medical Director of [employer].

## 2021-07-04 NOTE — Patient Instructions (Signed)
Treatment of ANOSMIA and Ageusia: If the combination of smell and vision of the same object that you're smelling improves the recovery of smell, then just smell alone. So we call that bimodal, visual and olfactory stimulation. So the standard olfactory training is four scents: rose, lemon, cloves and eucalyptus.  And the subject sniffs twice a day, 10, 12, 20 seconds each time the four essential oils for anywhere to six, eight or 12 weeks.  And olfactory training for anosmia has been around for maybe 10, 15 years, studies suggest it works.  There's nothing absolutely definitive- but it can't hurt. It's thought to work by retraining the brain, the process of neuroplasticity, retraining the brain to smell these odors again. And we think that by smelling, you can retrain the brain. Now, one of the outstanding questions is, are you retraining the brain to smell rose, lemon, cloves and eucalyptus better, but forget about coffee, vanilla, coconut?  How generalizable is the improvement in smell? Clair Gulling, we asked the patients what smells would you like to pick and train on? The answer was fascinating to [place]. I thought it might be coffee as a coffee lover. I thought it might be vanilla as a vanilla-bean ice cream lover. It was smoke. The number one odor, smell, if you will, that people wanted to train on and to get better was smoke for the reasons that we talked about before.  The sense of smell is first and foremost attached to safety. And these people felt that if they could smell smoke better, they would feel more safe.  Unfortunately, there isn't a smoke essential oil so we couldn't do that.  But it just reinforced to [place] how important it is for patients to pick what smells they want to train on.  The other unique aspect that should be brought up is that half of the group are looking at high-quality pictures of the item, one of four items they picked, and each one of them have high-quality photographs presented on  their iPhone or iPad at the same time that they smell.

## 2021-07-05 ENCOUNTER — Telehealth: Payer: Self-pay | Admitting: *Deleted

## 2021-07-05 LAB — COMPREHENSIVE METABOLIC PANEL
ALT: 12 IU/L (ref 0–32)
AST: 20 IU/L (ref 0–40)
Albumin/Globulin Ratio: 2.4 — ABNORMAL HIGH (ref 1.2–2.2)
Albumin: 4.6 g/dL (ref 3.8–4.9)
Alkaline Phosphatase: 97 IU/L (ref 44–121)
BUN/Creatinine Ratio: 18 (ref 9–23)
BUN: 14 mg/dL (ref 6–24)
Bilirubin Total: <0.2 mg/dL (ref 0.0–1.2)
CO2: 27 mmol/L (ref 20–29)
Calcium: 9.1 mg/dL (ref 8.7–10.2)
Chloride: 97 mmol/L (ref 96–106)
Creatinine, Ser: 0.77 mg/dL (ref 0.57–1.00)
Globulin, Total: 1.9 g/dL (ref 1.5–4.5)
Glucose: 90 mg/dL (ref 70–99)
Potassium: 4.1 mmol/L (ref 3.5–5.2)
Sodium: 137 mmol/L (ref 134–144)
Total Protein: 6.5 g/dL (ref 6.0–8.5)
eGFR: 93 mL/min/{1.73_m2} (ref >59)

## 2021-07-05 LAB — CBC WITH DIFFERENTIAL/PLATELET
Basophils Absolute: 0.1 10*3/uL (ref 0.0–0.2)
Basos: 1 %
EOS (ABSOLUTE): 0.1 10*3/uL (ref 0.0–0.4)
Eos: 1 %
Hematocrit: 40.8 % (ref 34.0–46.6)
Hemoglobin: 13.8 g/dL (ref 11.1–15.9)
Immature Grans (Abs): 0 10*3/uL (ref 0.0–0.1)
Immature Granulocytes: 0 %
Lymphocytes Absolute: 1.5 10*3/uL (ref 0.7–3.1)
Lymphs: 21 %
MCH: 30.9 pg (ref 26.6–33.0)
MCHC: 33.8 g/dL (ref 31.5–35.7)
MCV: 91 fL (ref 79–97)
Monocytes Absolute: 0.4 10*3/uL (ref 0.1–0.9)
Monocytes: 6 %
Neutrophils Absolute: 4.8 10*3/uL (ref 1.4–7.0)
Neutrophils: 71 %
Platelets: 218 10*3/uL (ref 150–450)
RBC: 4.47 x10E6/uL (ref 3.77–5.28)
RDW: 12 % (ref 11.7–15.4)
WBC: 6.8 10*3/uL (ref 3.4–10.8)

## 2021-07-05 NOTE — Progress Notes (Signed)
Normal CMET, normal CBC diff

## 2021-07-05 NOTE — Telephone Encounter (Signed)
-----   Message from Larey Seat, MD sent at 07/05/2021 12:10 PM EST ----- Normal CMET, normal CBC diff

## 2021-07-05 NOTE — Telephone Encounter (Signed)
Called and spoke with pt about lab results per Dr. Dohmeier's note. Pt verbalized understanding.  

## 2021-07-11 ENCOUNTER — Telehealth: Payer: Self-pay | Admitting: Neurology

## 2021-07-11 NOTE — Telephone Encounter (Signed)
LVM for pt to call me back to schedule sleep study  

## 2021-07-18 ENCOUNTER — Telehealth: Payer: Self-pay | Admitting: Neurology

## 2021-07-18 NOTE — Telephone Encounter (Signed)
LVM for pt to call me back to schedule sleep study  

## 2021-07-19 ENCOUNTER — Other Ambulatory Visit: Payer: BC Managed Care – PPO

## 2021-07-20 ENCOUNTER — Telehealth: Payer: Self-pay | Admitting: Neurology

## 2021-07-20 DIAGNOSIS — R928 Other abnormal and inconclusive findings on diagnostic imaging of breast: Secondary | ICD-10-CM

## 2021-07-20 DIAGNOSIS — Z1231 Encounter for screening mammogram for malignant neoplasm of breast: Secondary | ICD-10-CM

## 2021-07-20 NOTE — Telephone Encounter (Signed)
I have reschedule the patients MRI due to the power outage yesterday in the office for next Wednesday 07/26/21.  When you get a chance can you put a new MRI order in for me to schedule off of. Thank you.  Lorella Nimrod Josem Kaufmann: [phone] (exp. 07/10/21 to 08/08/21)

## 2021-07-20 NOTE — Telephone Encounter (Signed)
Order placed

## 2021-07-20 NOTE — Telephone Encounter (Signed)
Patient called back to schedule her sleep study, she would like a call back at your earliest convenient.

## 2021-07-24 NOTE — Telephone Encounter (Signed)
Returned patient's call and LVM for pt to call me back to schedule sleep study

## 2021-07-25 ENCOUNTER — Other Ambulatory Visit: Payer: Self-pay | Admitting: Obstetrics and Gynecology

## 2021-07-25 DIAGNOSIS — Z1231 Encounter for screening mammogram for malignant neoplasm of breast: Secondary | ICD-10-CM

## 2021-07-26 ENCOUNTER — Ambulatory Visit: Payer: BC Managed Care – PPO

## 2021-07-26 DIAGNOSIS — Z1231 Encounter for screening mammogram for malignant neoplasm of breast: Secondary | ICD-10-CM

## 2021-07-26 DIAGNOSIS — R41840 Attention and concentration deficit: Secondary | ICD-10-CM | POA: Diagnosis not present

## 2021-07-26 DIAGNOSIS — G9331 Postviral fatigue syndrome: Secondary | ICD-10-CM

## 2021-07-26 DIAGNOSIS — R928 Other abnormal and inconclusive findings on diagnostic imaging of breast: Secondary | ICD-10-CM

## 2021-07-26 MED ORDER — GADOBENATE DIMEGLUMINE 529 MG/ML IV SOLN
12.0000 mL | Freq: Once | INTRAVENOUS | Status: AC | PRN
  Administered 2021-07-26: 15:00:00 12 mL via INTRAVENOUS

## 2021-07-27 NOTE — Progress Notes (Signed)
Unremarkable MRI scan of the brain with and without contrast.  Incidental arachnoid cyst noted in the posterior fossa which is likely a benign congenital finding

## 2021-08-23 ENCOUNTER — Ambulatory Visit
Admission: RE | Admit: 2021-08-23 | Discharge: 2021-08-23 | Disposition: A | Discharge status: Home / Self Care | Payer: BC Managed Care – PPO | Source: Ambulatory Visit

## 2021-08-23 DIAGNOSIS — Z1231 Encounter for screening mammogram for malignant neoplasm of breast: Secondary | ICD-10-CM | POA: Diagnosis not present

## 2021-08-23 DIAGNOSIS — F331 Major depressive disorder, recurrent, moderate: Secondary | ICD-10-CM | POA: Diagnosis not present

## 2021-08-23 DIAGNOSIS — F411 Generalized anxiety disorder: Secondary | ICD-10-CM | POA: Diagnosis not present

## 2021-08-23 DIAGNOSIS — G47419 Narcolepsy without cataplexy: Secondary | ICD-10-CM | POA: Diagnosis not present

## 2021-08-30 ENCOUNTER — Ambulatory Visit (INDEPENDENT_AMBULATORY_CARE_PROVIDER_SITE_OTHER): Payer: BC Managed Care – PPO | Admitting: Neurology

## 2021-08-30 DIAGNOSIS — R432 Parageusia: Secondary | ICD-10-CM

## 2021-08-30 DIAGNOSIS — I950 Idiopathic hypotension: Secondary | ICD-10-CM

## 2021-08-30 DIAGNOSIS — M25521 Pain in right elbow: Secondary | ICD-10-CM | POA: Insufficient documentation

## 2021-08-30 DIAGNOSIS — R430 Anosmia: Secondary | ICD-10-CM

## 2021-08-30 DIAGNOSIS — G4721 Circadian rhythm sleep disorder, delayed sleep phase type: Secondary | ICD-10-CM

## 2021-08-30 DIAGNOSIS — U099 Post covid-19 condition, unspecified: Secondary | ICD-10-CM

## 2021-08-30 DIAGNOSIS — M7711 Lateral epicondylitis, right elbow: Secondary | ICD-10-CM | POA: Insufficient documentation

## 2021-08-30 DIAGNOSIS — R41840 Attention and concentration deficit: Secondary | ICD-10-CM

## 2021-08-30 DIAGNOSIS — G4733 Obstructive sleep apnea (adult) (pediatric): Secondary | ICD-10-CM | POA: Diagnosis not present

## 2021-08-30 DIAGNOSIS — G478 Other sleep disorders: Secondary | ICD-10-CM

## 2021-08-30 DIAGNOSIS — G9331 Postviral fatigue syndrome: Secondary | ICD-10-CM

## 2021-08-31 NOTE — Progress Notes (Signed)
[facility] TEST REPORT ( by Watch PAT)   STUDY DATA LOADED :  08-31-2021   ORDERING CLINICIAN: Larey Seat, MD  REFERRING CLINICIAN: Dr Francesco Sor, DO   CLINICAL INFORMATION/HISTORY: 07/04/2021 . Chief concern according to patient :  " I have once or twice in my life woken up and felt rested" .  Presents today referred by PCP. She states that she had a SS 16+ yrs ago negative for OSA or PLMs. There was potential concern for  delayed Sleep phase disorder. She uses [place] and xanax for sleep. With these, she can sleep for 8 hrs with 1 bathroom break. She denied snoring or apnea. Wakes up still not feeling well rested.   I have the pleasure of seeing Whitney Guzman today, a right-handed Caucasian female with a medical history of being Allergic to dust and bee stings, having Anxiety, Chronic back pain, Depression, Endometriosis, GERD (gastroesophageal reflux disease), Hypotension, and Migraine without aura. Chronic headaches, Mental fatigue / COVID fog. Since April 2022's COVID infection, she has more cognitive difficulties, and reports partial loss of smell and taste.  Low energy.  Worse since COVID.   The patient had the first sleep study in the year 2004 at [facility] , with Dr Mammie Russian, who diagnosed her with sleep phase syndrome. CBT was initiated -she uses a day light box at 10.000 lux.   Set a bedtime. She has been on NUVIGIL in generic form.   New evaluation for postviral fatigue, changes in fatigue level, ruling out OSA or hypoxia.    Epworth sleepiness score: 10 /24.  FSS at 48/63 points.  BMI:  22.8 kg/m  Neck Circumference: 13"   FINDINGS:   Sleep Summary:   Total Recording Time (hours, min):   Total recording time amounted to 8 hours 43 minutes of which 8 hours and 2 minutes were calculated total sleep time , with  13.7% REM sleep.  Respiratory Indices:   Calculated pAHI (per hour):   2.9/h                          REM pAHI:    6.4/h                                              NREM pAHI: 2.3/h                         Positional AHI:    The patient slept 462 minutes in supine sleep position with an AHI of 2.6/h.  RDI of 5.1/h.  There were 16.5 minutes of sleep recorded on the right side.  This was associate with 11.1 apneas per hour.  RDI was 29.5/h.  This is a very unusual distribution -and makes me question if the chest wall electrode indicated the correct sleep position.     Snoring: was present for 99.2% of the total sleep time.  That means that audible breathing or snoring accompanied the sleep all night.  A volume of snoring above 50 and 60 dB was briefly present as well.   The mean volume of snoring was 45 dB which was louder than I expected.  Oxygen Saturation Statistics:   Oxygen Saturation (%) Mean:   94%.             O2 Saturation Range (%): Varied between a nadir at 81% and a maximum of 100% with a mean saturation of 94% oxygen                                      O2 Saturation (minutes) <89%: 0.1 minutes         Pulse Rate Statistics:   Pulse Mean (bpm):   Between 46 and 111 bpm with a mean heart rate of 61 bpm.              Sleep architecture : Graphic display of the patient's sleep architecture shows sustained uninterrupted sleep for several hours.  There were 2 short periods of wakefulness.  Deep or so-called slow-wave sleep was seen in 7 intervals shorter than 10 minutes.               IMPRESSION:  This HST confirms that sleep apnea is not present, and that the sleep architecture is normal for age and gender.  There was no evidence of hypoxia of clinical significance.  My concern is that the patient may have upper airway resistance syndrome-mild snoring by volume was present for 99.2% of the total sleep time.  That means that audible breathing or snoring accompanied all night.  A volume of snoring up above 50 and 60 dB was brief really present as well.  The mean  volume of snoring was 45 dB which was louder than I expected.   RECOMMENDATION: There is truly not enough apnea present to justify any intervention, but this snoring was captured via chest wall electrode which measures vibration of the chest wall. In order to treat the snoring and upper airway resistance a a dental device could be used and if her AHI would have exceeded 5/h I would also be able to use CPAP. Please note that Lamictal-lamotrigine medication can have sleep suppressant side effects.  When I meet with this patient again, we will do a [place] cognitive assessment, every testing of her olfactory battery, and discussed the MRI ( brain) findings.    INTERPRETING PHYSICIAN:   Larey Seat, MD   Medical Director of [facility] Sleep at [facility].

## 2021-09-04 DIAGNOSIS — L821 Other seborrheic keratosis: Secondary | ICD-10-CM | POA: Diagnosis not present

## 2021-09-04 DIAGNOSIS — L820 Inflamed seborrheic keratosis: Secondary | ICD-10-CM | POA: Diagnosis not present

## 2021-09-06 ENCOUNTER — Telehealth: Payer: Self-pay | Admitting: Neurology

## 2021-09-06 DIAGNOSIS — G9331 Postviral fatigue syndrome: Secondary | ICD-10-CM | POA: Insufficient documentation

## 2021-09-06 DIAGNOSIS — G4721 Circadian rhythm sleep disorder, delayed sleep phase type: Secondary | ICD-10-CM | POA: Insufficient documentation

## 2021-09-06 DIAGNOSIS — R430 Anosmia: Secondary | ICD-10-CM | POA: Insufficient documentation

## 2021-09-06 DIAGNOSIS — G478 Other sleep disorders: Secondary | ICD-10-CM | POA: Insufficient documentation

## 2021-09-06 DIAGNOSIS — U099 Post covid-19 condition, unspecified: Secondary | ICD-10-CM | POA: Insufficient documentation

## 2021-09-06 DIAGNOSIS — R432 Parageusia: Secondary | ICD-10-CM | POA: Insufficient documentation

## 2021-09-06 DIAGNOSIS — I950 Idiopathic hypotension: Secondary | ICD-10-CM | POA: Insufficient documentation

## 2021-09-06 NOTE — Procedures (Signed)
[facility] TEST REPORT ( by Watch PAT)   STUDY DATA LOADED :  08-31-2021   ORDERING CLINICIAN: Larey Seat, MD  REFERRING CLINICIAN: Dr Francesco Sor, DO   CLINICAL INFORMATION/HISTORY: 07/04/2021 . Chief concern according to patient :  " I have once or twice in my life woken up and felt rested" .  Presents today referred by PCP. She states that she had a SS 16+ yrs ago negative for OSA or PLMs. There was potential concern for  delayed Sleep phase disorder. She uses [place] and xanax for sleep. With these, she can sleep for 8 hrs with 1 bathroom break. She denied snoring or apnea. Wakes up still not feeling well rested.   I have the pleasure of seeing Whitney Guzman today, a right-handed Caucasian female with a medical history of being Allergic to dust and bee stings, having Anxiety, Chronic back pain, Depression, Endometriosis, GERD (gastroesophageal reflux disease), Hypotension, and Migraine without aura. Chronic headaches, Mental fatigue / COVID fog. Since April 2022's COVID infection, she has more cognitive difficulties, and reports partial loss of smell and taste.  Low energy.  Worse since COVID.   The patient had the first sleep study in the year 2004 at [facility] , with Dr Mammie Russian, who diagnosed her with sleep phase syndrome. CBT was initiated -she uses a day light box at 10.000 lux.   Set a bedtime. She has been on NUVIGIL in generic form.   New evaluation for postviral fatigue, changes in fatigue level, ruling out OSA or hypoxia.    Epworth sleepiness score: 10 /24.  FSS at 48/63 points.  BMI:  22.8 kg/m  Neck Circumference: 13"   FINDINGS:   Sleep Summary:   Total Recording Time (hours, min):   Total recording time amounted to 8 hours 43 minutes of which 8 hours and 2 minutes were calculated total sleep time , with  13.7% REM sleep.  Respiratory Indices:   Calculated pAHI (per hour):   2.9/h                          REM pAHI:    6.4/h                                              NREM pAHI: 2.3/h                         Positional AHI:    The patient slept 462 minutes in supine sleep position with an AHI of 2.6/h.  RDI of 5.1/h.  There were 16.5 minutes of sleep recorded on the right side.  This was associate with 11.1 apneas per hour.  RDI was 29.5/h.  This is a very unusual distribution -and makes me question if the chest wall electrode indicated the correct sleep position.     Snoring: was present for 99.2% of the total sleep time.  That means that audible breathing or snoring accompanied the sleep all night.  A volume of snoring above 50 and 60 dB was briefly present as well.   The mean volume of snoring was 45 dB which was louder than I expected.  Oxygen Saturation Statistics:   Oxygen Saturation (%) Mean:   94%.             O2 Saturation Range (%): Varied between a nadir at 81% and a maximum of 100% with a mean saturation of 94% oxygen                                      O2 Saturation (minutes) <89%: 0.1 minutes         Pulse Rate Statistics:   Pulse Mean (bpm):   Between 46 and 111 bpm with a mean heart rate of 61 bpm.              Sleep architecture : Graphic display of the patient's sleep architecture shows sustained uninterrupted sleep for several hours.  There were 2 short periods of wakefulness.  Deep or so-called slow-wave sleep was seen in 7 intervals shorter than 10 minutes.               IMPRESSION:  This HST confirms that sleep apnea is not present, and that the sleep architecture is normal for age and gender.  There was no evidence of hypoxia of clinical significance.  My concern is that the patient may have upper airway resistance syndrome-mild snoring by volume was present for 99.2% of the total sleep time.  That means that audible breathing or snoring accompanied all night.  A volume of snoring up above 50 and 60 dB was brief really present as well.  The mean volume of  snoring was 45 dB which was louder than I expected.   RECOMMENDATION: There is truly not enough apnea present to justify any intervention, but this snoring was captured via chest wall electrode which measures vibration of the chest wall. In order to treat the snoring and upper airway resistance a a dental device could be used and if her AHI would have exceeded 5/h I would also be able to use CPAP. Please note that Lamictal-lamotrigine medication can have sleep suppressant side effects.  When I meet with this patient again, we will do a [place] cognitive assessment, every testing of her olfactory battery, and discussed the MRI ( brain) findings.    INTERPRETING PHYSICIAN:   Larey Seat, MD   Medical Director of [facility] Sleep at [facility].

## 2021-09-06 NOTE — Progress Notes (Signed)
IMPRESSION:  This HST confirms that sleep apnea is not present, and that the sleep architecture is normal for age and gender.  There was no evidence of hypoxia of clinical significance.  My concern is that the patient may have upper airway resistance syndrome-mild snoring by volume was present for 99.2% of the total sleep time.  That means that audible breathing or snoring accompanied all night.  A volume of snoring up above 50 and 60 dB was brief really present as well.  The mean volume of snoring was 45 dB which was louder than I expected.   RECOMMENDATION: There is truly not enough apnea present to justify any intervention, but this snoring was captured via chest wall electrode which measures vibration of the chest wall. In order to treat the snoring and upper airway resistance a a dental device could be used and if her AHI would have exceeded 5/h I would also be able to use CPAP.  Please note that Lamictal-lamotrigine medication can have sleep suppressing/ insomnia inducing side effects.  When I meet with this patient again, we will perform a [place] cognitive assessment, repeated testing of her olfactory sense, and will discuss the MRI ( brain) findings.  Please related to Whitney Guzman that we can refer to a sleep dentist ( Dr Ron Parker, Dr Toy Cookey or Dr Evelene Croon)    INTERPRETING PHYSICIAN:   Larey Seat, MD   Medical Director of [facility] Sleep at [facility].

## 2021-09-06 NOTE — Telephone Encounter (Signed)
-----   Message from Larey Seat, MD sent at 09/06/2021 12:48 PM EST ----- IMPRESSION:  This HST confirms that sleep apnea is not present, and that the sleep architecture is normal for age and gender.  There was no evidence of hypoxia of clinical significance.  My concern is that the patient may have upper airway resistance syndrome-mild snoring by volume was present for 99.2% of the total sleep time.  That means that audible breathing or snoring accompanied all night.  A volume of snoring up above 50 and 60 dB was brief really present as well.  The mean volume of snoring was 45 dB which was louder than I expected.   RECOMMENDATION: There is truly not enough apnea present to justify any intervention, but this snoring was captured via chest wall electrode which measures vibration of the chest wall. In order to treat the snoring and upper airway resistance a a dental device could be used and if her AHI would have exceeded 5/h I would also be able to use CPAP.  Please note that Lamictal-lamotrigine medication can have sleep suppressing/ insomnia inducing side effects.  When I meet with this patient again, we will perform a [place] cognitive assessment, repeated testing of her olfactory sense, and will discuss the MRI ( brain) findings.  Please related to Mrs Yoshino that we can refer to a sleep dentist ( Dr Ron Parker, Dr Toy Cookey or Dr Evelene Croon)    INTERPRETING PHYSICIAN:   Larey Seat, MD   Medical Director of [facility] Sleep at [facility].

## 2021-09-06 NOTE — Telephone Encounter (Signed)
Called patient to discuss sleep study results. No answer at this time. LVM for the patient to call back.  Will send a mychart message as well. 

## 2021-09-11 ENCOUNTER — Encounter: Payer: Self-pay | Admitting: Neurology

## 2021-09-22 DIAGNOSIS — M25521 Pain in right elbow: Secondary | ICD-10-CM | POA: Diagnosis not present

## 2021-09-25 ENCOUNTER — Ambulatory Visit (INDEPENDENT_AMBULATORY_CARE_PROVIDER_SITE_OTHER): Payer: BC Managed Care – PPO | Admitting: Neurology

## 2021-09-25 ENCOUNTER — Encounter: Payer: Self-pay | Admitting: Neurology

## 2021-09-25 ENCOUNTER — Other Ambulatory Visit: Payer: Self-pay

## 2021-09-25 VITALS — BP 111/49 | HR 66 | Ht 64.5 in | Wt 138.5 lb

## 2021-09-25 DIAGNOSIS — G9331 Postviral fatigue syndrome: Secondary | ICD-10-CM

## 2021-09-25 DIAGNOSIS — R41840 Attention and concentration deficit: Secondary | ICD-10-CM

## 2021-09-25 DIAGNOSIS — G4721 Circadian rhythm sleep disorder, delayed sleep phase type: Secondary | ICD-10-CM | POA: Diagnosis not present

## 2021-09-25 DIAGNOSIS — R432 Parageusia: Secondary | ICD-10-CM

## 2021-09-25 DIAGNOSIS — R430 Anosmia: Secondary | ICD-10-CM | POA: Diagnosis not present

## 2021-09-25 DIAGNOSIS — U099 Post covid-19 condition, unspecified: Secondary | ICD-10-CM

## 2021-09-25 NOTE — Patient Instructions (Signed)
Patient"s HST suggested snoring but no apnea of clinical significance.    She is not known to snore- we will recheck woth a proper PSG.  I will consider a dental device referral .   MOCA with this visit will be added.

## 2021-09-25 NOTE — Progress Notes (Signed)
SLEEP MEDICINE CLINIC    Provider:  Larey Seat, MD  Primary Care Physician:  Sueanne Margarita, [place] [address]     Referring Provider: Sueanne Margarita, Do [address] [place],  [place] [address]          Chief Complaint according to patient   Patient presents with:     Covid patient Patient (Re- Visit)     Cognitive and anosmia concerns, non -restorative sleep.       HISTORY OF PRESENT ILLNESS:    09-25-2021: Interval history - We discussed today the result of MRI brain and HST- the sleep study was not indicating apnea, AHI 2.9/h, and sleep was as usual for her.  We are in a bin, she has likely snored, but the husband has not commented on snoring. She is sure she doesn't snore,  no GERD, no dry mouth-  Husband has hearing loss.  She had a jaw surgery  for overbite and TMJ with mandibular 'reset' a age 51.  No TMJ pain since. Can she use a dental device ? I will try to get an in-lab study to see why snoring was present on a HST when it has apparently never been commented on?   MRI brain for post Covid illness with anosmia. Normal! No atrophy. Pineal cyst , 2.4 by 1.7 cm     She feels still a slow and steady improvement in her olfactory sense.      07-04-2021: Keshauna Degraffenreid is a 51 - year- old Caucasian female patient who is seen here upon a requested SLEEP CONSULTATION per Dr Francesco Sor, DO .  Referral on 09/25/2021 . Chief concern according to patient :  " I have once or twice in my life woken up and felt rested" suspected to have a sleep phase syndrome.   Presents today referred by PCP. Has had hx of sleep complications for a while. She states that she had a SS 16+ yrs ago and was negative. There was potential concern for ? Sleep phase disorder. She uses [place] and xanax for sleep. With that can sleep for 8 hrs with 1 bathroom break. She denied snoring or apnea. Wakes up still not feeling well rested.   I have the pleasure of seeing Eliya Bubar today, a right-handed Caucasian female with a possible sleep disorder.  She  has a past medical history of Allergic to dust and bee stings. Anxiety, Chronic lower back pain, Depression, Endometriosis, GERD (gastroesophageal reflux disease), Hypotension, and Migraine without aura. Chronic headaches, Mental fatigue , foggy. Since April 22 COVID infection, has more cognitive difficulties,  partially loss of smell and taste.  Fever and sick. Low energy.  Worse since COVID. Her husband and father in law are patient's here.  The patient had the first sleep study in the year 2004 at [facility] , with Dr Mammie Russian, and diagnosed with sleep phase syndrome. CBT was initiated she uses a day light box. 10.000 lux.   Set a bedtime. She has been on NUVIGIL in generic form.    Sleep relevant medical history: No Sleep walking, new onset sleep talking,vivid dreams. Family medical /sleep history: no other biological family member on CPAP with OSA, father had been a sleep walker.    Social history:  Patient is working as a Transport planner, English as a second language teacher.  and lives in a household with husband and one son, one dog.  Tele health- and office hours. Tobacco use -none .  ETOH use :  3 drinks a months, Caffeine intake in form of Tea ( hot, in AM 2-3 mugs ) but no energy drinks. Regular exercise - none since Covid.19, walks her dog,   Hobbies :piano, reading.       Sleep habits are as follows: The patient's dinner time is between 6.30 and 7 PM. The patient goes to bed at 11 PM  without being tired, she takes 3 mg melatonin at night, and continues to sleep for 3-4 hours and can go back to bed after bathroom break, cool, quiet and dark.  The preferred sleep position is supine, with the support of 1 pillow plus 2 under her knees.   Dreams are reportedly frequent/vivid/ sometimes feeling angry.   8.10  AM is the usual rise time. The patient wakes up with an alarm- 7. 50 AM difficult to rise. .  She reports not feeling  refreshed or restored in AM, with symptoms such as rare morning headaches, clenching , TMJ, and residual fatigue.Naps are taken infrequently, but she would love to have time to do so- on weekends lasting from 1-2 hours and are more refreshing than nocturnal sleep.    Review of Systems: Out of a complete 14 system review, the patient complains of only the following symptoms, and all other reviewed systems are negative.:  Fatigue, sleepiness , snoring, fragmented sleep, Insomnia with delayed , prolonged sleep latency , anxiety since HS , apprehension about sleep.   Chronic headaches, Mental fatigue , foggy.   Since April 22 COVID infection, has more cognitive difficulties,  partially loss of smell and taste.  Fever and sick. Low energy.  Worse since COVID.    How likely are you to doze in the following situations: 0 = not likely, 1 = slight chance, 2 = moderate chance, 3 = high chance   Sitting and Reading?1 Watching Television?0 Sitting inactive in a public place (theater or meeting)? As a passenger in a car for an hour without a break?2 Lying down in the afternoon when circumstances permit? 3 Sitting and talking to someone?0 Sitting quietly after lunch without alcohol?1 In a car, while stopped for a few minutes in traffic?0   Total =  7/ 24 points on 09-25-2021.   FSS endorsed at 26/ 63 points. Down from 17!!!!   Social History   Socioeconomic History   Marital status: Married    Spouse name: Not on file   Number of children: Not on file   Years of education: Not on file   Highest education level: Not on file  Occupational History   Not on file  Tobacco Use   Smoking status: Never   Smokeless tobacco: Never  Vaping Use   Vaping Use: Never used  Substance and Sexual Activity   Alcohol use: Yes    Alcohol/week: 0.0 - 1.0 standard drinks    Comment: rarely   Drug use: No   Sexual activity: Yes    Partners: Male    Birth control/protection: Condom    Comment: condoms   Other Topics Concern   Not on file  Social History Narrative   Not on file   Social Determinants of Health   Financial Resource Strain: Not on file  Food Insecurity: Not on file  Transportation Needs: Not on file  Physical Activity: Not on file  Stress: Not on file  Social Connections: Not on file    Family History  Problem Relation Age of Onset   Depression Father    Anxiety disorder Father  Lung cancer Father 65   Melanoma Father 36   Anxiety disorder Sister    Breast cancer Sister 55   Colon cancer Maternal Grandmother 74   Colon polyps Maternal Grandmother    Cancer Sister 47       melanoma   Anxiety disorder Maternal Grandfather    Esophageal cancer Neg Hx    Stomach cancer Neg Hx    Rectal cancer Neg Hx     Past Medical History:  Diagnosis Date   Anxiety    on meds   Chronic lower back pain    Depression    on meds   Endometriosis    GERD (gastroesophageal reflux disease)    hx of   Migraine without aura     Past Surgical History:  Procedure Laterality Date   APPENDECTOMY and treatment of endometriosis  2005   [place]   TMJ ARTHROSCOPY  1995   WISDOM TOOTH EXTRACTION       Current Outpatient Medications on File Prior to Visit  Medication Sig Dispense Refill   ALPRAZolam (XANAX) 0.25 MG tablet TK 1-2 TS PO QHS  3   Armodafinil 200 MG TABS Take 1 tablet by mouth daily.     butalbital-acetaminophen-caffeine (FIORICET) 50-325-40 MG tablet Take 1-2 tablets by mouth every 6 (six) hours as needed.     clotrimazole-betamethasone (LOTRISONE) cream Apply 1 application topically as needed.     escitalopram (LEXAPRO) 20 MG tablet Take 20 mg by mouth daily.     ibuprofen (ADVIL) 200 MG tablet Take 200 mg by mouth every 6 (six) hours as needed.     lamoTRIgine (LAMICTAL) 150 MG tablet Take 150 mg by mouth daily.     zolpidem (AMBIEN) 10 MG tablet Take 10 mg by mouth at bedtime as needed for sleep.     Current Facility-Administered  Medications on File Prior to Visit  Medication Dose Route Frequency Provider Last Rate Last Admin   0.9 %  sodium chloride infusion  500 mL Intravenous Once Gatha Mayer, MD        Allergies  Allergen Reactions   Bee Venom      IMPRESSION: Unremarkable MRI scan of the brain with and without contrast.  Incidental arachnoid cyst noted in the posterior fossa which is likely a benign congenital finding Physical exam:  Today's Vitals   09/25/21 0829  BP: (!) 111/49  Pulse: 66  Weight: 138 lb 8 oz (62.8 kg)  Height: 5' 4.5" (1.638 m)   Body mass index is 23.41 kg/m.   Wt Readings from Last 3 Encounters:  09/25/21 138 lb 8 oz (62.8 kg)  07/04/21 137 lb (62.1 kg)  02/20/21 138 lb (62.6 kg)     Ht Readings from Last 3 Encounters:  09/25/21 5' 4.5" (1.638 m)  07/04/21 5' 4.5" (1.638 m)  02/20/21 5' 4.5" (1.638 m)      General: The patient is awake, alert and appears not in acute distress. The patient is well groomed. Head: Normocephalic, atraumatic. Neck is supple. Mallampati 1,  neck circumference:13 inches . Nasal airflow patent. Loss of taste and smell.  Retrognathia is not  seen.  Dental status: 1995 braces and mandibular surgery . Cardiovascular:  Regular rate and cardiac rhythm by pulse,  without distended neck veins. Respiratory: Lungs are clear to auscultation.  Skin:  Without evidence of ankle edema, or rash. Trunk: The patient's posture is erect.   Neurologic exam : The patient is awake and alert, oriented to  place and time.   Memory subjective described as impaired, more distractible since COVID- "zoning out.". Attention span & concentration ability appears normal.  Speech is fluent,  without  dysarthria, dysphonia or aphasia.  Mood and affect are appropriate.   Cranial nerves: partial loss of smell or taste reported - post covid.    09-25-2021:  Lemon oil - identified as citrus. Grapefruit - not sure.  Vanilla  extract - identified. Peppermint -  identified. Almond oil- not identified Coconut - not identified/ butter on popcorn. Lavender - identified.  Coffee- identified.  Smoke - identified.    Pupils are equal and briskly reactive to light. Funduscopic exam deferred. .  Extraocular movements in vertical and horizontal planes were intact and without nystagmus. No Diplopia. Visual fields by finger perimetry are intact. Hearing was intact to soft voice and finger rubbing.    Facial sensation intact to fine touch.  Facial motor strength is symmetric and tongue and uvula move midline.  Neck ROM : rotation, tilt and flexion extension were normal for age and shoulder shrug was symmetrical.    Motor exam:  Symmetric bulk, tone and ROM.   Normal tone without cog wheeling, symmetric grip strength . Right side tennis elbow/braces at night- Sensory:  Fine touch, pinprick and vibration were tested  and  normal.  Proprioception tested in the upper extremities was normal. Coordination: Rapid alternating movements in the fingers/hands were of normal speed.  The Finger-to-nose maneuver was intact without evidence of ataxia, dysmetria or tremor.  Gait and station: Patient could rise unassisted from a seated position, walked without assistive device. Stance is of normal width/ base and the patient turned with 3 steps.  Toe and heel walk were deferred.  Deep tendon reflexes: in the  upper and lower extremities are symmetric and intact.  Babinski response was deferred .       After spending a total time of 30 minutes including face to face and additional time for physical and neurologic examination, review of laboratory studies,  personal review of imaging studies, reports and results of other testing and review of referral information / records as far as provided in visit, I have established the following assessments:  1) post Covid fatigue syndrome, prolonged recovery of anosmia ,ageusia.  She can now chew a spicy cinnamon gum which she would  not have tolerated before. She feels her sense of smell and taste is recovering.   Couldn't smell a cake she recently baked for her family when she was congratulated on its delicious smell. 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   2) "covid brain fog "  no MOCA today,   3) Resetting the sleep phase set rigid rise time.  delayed sleep latency for many, many years, partially about apprehension, worried about sleep onset and total sleep time. Patient"s HST suggested snoring but no apnea of clinical significance.  She is not known to snore- we will recheck woth a proper PSG.    My Plan is to proceed with:  I would like to thank Sueanne Margarita, DO / [address] [place],  [place] [address] for allowing me to meet with and to take care of this pleasant patient.   In short, [place] is presenting with post covid fatigue , mild cognitive concerns, loss of smell and taste, and has longstanding sleep issues.  I plan to follow up  personally  after the in lab sleep study within 3-4 months.  I added a MOCA with  my Nurse.   CC: I will share my notes with PCP.   Electronically signed by: Larey Seat, MD 09/25/2021 8:38 AM  [facility] Board certified by The [employer] of Sleep Medicine and Diplomate of the [employer] of Sleep Medicine. Board certified In Neurology through the [employer], Fellow of the [employer] of Neurology. Medical Director of [facility].

## 2021-10-02 ENCOUNTER — Telehealth: Payer: Self-pay

## 2021-10-02 DIAGNOSIS — G4721 Circadian rhythm sleep disorder, delayed sleep phase type: Secondary | ICD-10-CM

## 2021-10-02 DIAGNOSIS — R430 Anosmia: Secondary | ICD-10-CM

## 2021-10-02 DIAGNOSIS — R432 Parageusia: Secondary | ICD-10-CM

## 2021-10-02 DIAGNOSIS — G9331 Postviral fatigue syndrome: Secondary | ICD-10-CM

## 2021-10-02 DIAGNOSIS — G478 Other sleep disorders: Secondary | ICD-10-CM

## 2021-10-02 NOTE — Telephone Encounter (Signed)
Insurance is not allowing for Inlab study at this time. Need to repeat HST first. I need another HST order please.

## 2021-10-02 NOTE — Addendum Note (Signed)
Addended by: Wyvonnia Lora on: 10/02/2021 05:08 PM   Modules accepted: Orders

## 2021-10-13 DIAGNOSIS — M25521 Pain in right elbow: Secondary | ICD-10-CM | POA: Diagnosis not present

## 2021-10-18 ENCOUNTER — Telehealth: Payer: Self-pay

## 2021-10-18 NOTE — Telephone Encounter (Signed)
LVM for pt to call me back to schedule sleep study  

## 2021-10-23 ENCOUNTER — Telehealth: Payer: Self-pay

## 2021-10-23 NOTE — Telephone Encounter (Signed)
Returned pt's call and LVM for pt to call me back to schedule sleep study ° °

## 2021-11-02 ENCOUNTER — Telehealth: Payer: Self-pay

## 2021-11-02 NOTE — Telephone Encounter (Signed)
We have attempted to call the patient 2 times to schedule sleep study. Patient has been unavailable at the phone numbers we have on file and has not returned our calls. If patient calls back we will schedule them for their sleep study. ° °

## 2021-11-15 DIAGNOSIS — F411 Generalized anxiety disorder: Secondary | ICD-10-CM | POA: Diagnosis not present

## 2021-11-15 DIAGNOSIS — G47419 Narcolepsy without cataplexy: Secondary | ICD-10-CM | POA: Diagnosis not present

## 2021-11-15 DIAGNOSIS — F331 Major depressive disorder, recurrent, moderate: Secondary | ICD-10-CM | POA: Diagnosis not present

## 2021-11-20 ENCOUNTER — Telehealth: Payer: Self-pay

## 2021-11-20 NOTE — Telephone Encounter (Signed)
Returned pt's call and LVM for pt to call me back to schedule sleep study ° °

## 2021-11-30 ENCOUNTER — Telehealth: Payer: Self-pay

## 2021-11-30 NOTE — Telephone Encounter (Signed)
LVM for pt to call me back to schedule sleep study  

## 2021-12-05 ENCOUNTER — Ambulatory Visit: Payer: BC Managed Care – PPO | Admitting: Neurology

## 2021-12-11 DIAGNOSIS — F331 Major depressive disorder, recurrent, moderate: Secondary | ICD-10-CM | POA: Diagnosis not present

## 2021-12-11 DIAGNOSIS — G47419 Narcolepsy without cataplexy: Secondary | ICD-10-CM | POA: Diagnosis not present

## 2021-12-11 DIAGNOSIS — F411 Generalized anxiety disorder: Secondary | ICD-10-CM | POA: Diagnosis not present

## 2022-01-03 DIAGNOSIS — M25521 Pain in right elbow: Secondary | ICD-10-CM | POA: Diagnosis not present

## 2022-01-04 DIAGNOSIS — L821 Other seborrheic keratosis: Secondary | ICD-10-CM | POA: Diagnosis not present

## 2022-01-04 DIAGNOSIS — L814 Other melanin hyperpigmentation: Secondary | ICD-10-CM | POA: Diagnosis not present

## 2022-01-04 DIAGNOSIS — D2261 Melanocytic nevi of right upper limb, including shoulder: Secondary | ICD-10-CM | POA: Diagnosis not present

## 2022-01-04 DIAGNOSIS — D1801 Hemangioma of skin and subcutaneous tissue: Secondary | ICD-10-CM | POA: Diagnosis not present

## 2022-01-12 DIAGNOSIS — M25521 Pain in right elbow: Secondary | ICD-10-CM | POA: Diagnosis not present

## 2022-01-26 DIAGNOSIS — M25521 Pain in right elbow: Secondary | ICD-10-CM | POA: Diagnosis not present

## 2022-02-14 DIAGNOSIS — G47419 Narcolepsy without cataplexy: Secondary | ICD-10-CM | POA: Diagnosis not present

## 2022-02-14 DIAGNOSIS — F411 Generalized anxiety disorder: Secondary | ICD-10-CM | POA: Diagnosis not present

## 2022-02-14 DIAGNOSIS — F331 Major depressive disorder, recurrent, moderate: Secondary | ICD-10-CM | POA: Diagnosis not present

## 2022-02-15 NOTE — Progress Notes (Deleted)
51 y.o. G60P1001 Married White or Caucasian Not Hispanic or Latino female here for annual exam.      No LMP recorded.          Sexually active: {yes no:314532}  The current method of family planning is {contraception:315051}.    Exercising: {yes no:314532}  {types:19826} Smoker:  {YES [id number]  Health Maintenance: Pap:  02/20/21 WNL Hr HPV Neg,  11/21/2016 WNL NEG HPV History of abnormal Pap:  no MMG:  08/24/21 density C Bi-rads 1 neg  BMD:   none  Colonoscopy: 06/20/20, normal. F/U in 10 years.   TDaP:  07/05/12  Gardasil: NA   reports that she has never smoked. She has never used smokeless tobacco. She reports current alcohol use. She reports that she does not use drugs.  Past Medical History:  Diagnosis Date   Anxiety    on meds   Chronic lower back pain    Depression    on meds   Endometriosis    GERD (gastroesophageal reflux disease)    hx of   Migraine without aura     Past Surgical History:  Procedure Laterality Date   APPENDECTOMY and treatment of endometriosis  2005   STRABISMUS SURGERY  1975   TMJ ARTHROSCOPY  1995   WISDOM TOOTH EXTRACTION      Current Outpatient Medications  Medication Sig Dispense Refill   ALPRAZolam (XANAX) 0.25 MG tablet TK 1-2 TS PO QHS  3   Armodafinil 200 MG TABS Take 1 tablet by mouth daily.     butalbital-acetaminophen-caffeine (FIORICET) 50-325-40 MG tablet Take 1-2 tablets by mouth every 6 (six) hours as needed.     clotrimazole-betamethasone (LOTRISONE) cream Apply 1 application topically as needed.     escitalopram (LEXAPRO) 20 MG tablet Take 20 mg by mouth daily.     ibuprofen (ADVIL) 200 MG tablet Take 200 mg by mouth every 6 (six) hours as needed.     lamoTRIgine (LAMICTAL) 150 MG tablet Take 150 mg by mouth daily.     zolpidem (AMBIEN) 10 MG tablet Take 10 mg by mouth at bedtime as needed for sleep.     Current Facility-Administered Medications  Medication Dose Route Frequency Provider Last Rate Last Admin   0.9 %  sodium  chloride infusion  500 mL Intravenous Once Gatha Mayer, MD        Family History  Problem Relation Age of Onset   Depression Father    Anxiety disorder Father    Lung cancer Father 35   Melanoma Father 29   Anxiety disorder Sister    Breast cancer Sister 24   Colon cancer Maternal Grandmother 71   Colon polyps Maternal Grandmother    Cancer Sister 9       melanoma   Anxiety disorder Maternal Grandfather    Esophageal cancer Neg Hx    Stomach cancer Neg Hx    Rectal cancer Neg Hx     Review of Systems  Exam:   There were no vitals taken for this visit.  Weight change: '@WEIGHTCHANGE'$ @ Height:      Ht Readings from Last 3 Encounters:  09/25/21 5' 4.5" (1.638 m)  07/04/21 5' 4.5" (1.638 m)  02/20/21 5' 4.5" (1.638 m)    General appearance: alert, cooperative and appears stated age Head: Normocephalic, without obvious abnormality, atraumatic Neck: no adenopathy, supple, symmetrical, trachea midline and thyroid {CHL AMB PHY EX THYROID NORM [phone]::"normal to inspection and palpation"} Lungs: clear to auscultation bilaterally Cardiovascular: regular rate and  rhythm Breasts: {Exam; breast:13139::"normal appearance, no masses or tenderness"} Abdomen: soft, non-tender; non distended,  no masses,  no organomegaly Extremities: extremities normal, atraumatic, no cyanosis or edema Skin: Skin color, texture, turgor normal. No rashes or lesions Lymph nodes: Cervical, supraclavicular, and axillary nodes normal. No abnormal inguinal nodes palpated Neurologic: Grossly normal   Pelvic: External genitalia:  no lesions              Urethra:  normal appearing urethra with no masses, tenderness or lesions              Bartholins and Skenes: normal                 Vagina: normal appearing vagina with normal color and discharge, no lesions              Cervix: {CHL AMB PHY EX CERVIX NORM DEFAULT:[phone]::"no lesions"}               Bimanual Exam:  Uterus:  {CHL AMB PHY EX  UTERUS NORM [phone]::"normal size, contour, position, consistency, mobility, non-tender"}              Adnexa: {CHL AMB PHY EX ADNEXA NO MASS DEFAULT:[phone]::"no mass, fullness, tenderness"}               Rectovaginal: Confirms               Anus:  normal sphincter tone, no lesions  *** chaperoned for the exam.  A:  Well Woman with normal exam  P:

## 2022-02-21 ENCOUNTER — Ambulatory Visit: Payer: BC Managed Care – PPO | Admitting: Obstetrics and Gynecology

## 2022-02-21 NOTE — Progress Notes (Deleted)
51 y.o. G54P1001 Married White or Caucasian Not Hispanic or Latino female here for annual exam.      No LMP recorded.          Sexually active: {yes no:314532}  The current method of family planning is {contraception:315051}.    Exercising: {yes no:314532}  {types:19826} Smoker:  {YES [id number]  Health Maintenance: Pap: 02/20/21 WNL Hr HPV neg,  11/21/2016 WNL NEG HPV History of abnormal Pap:  no MMG:  08/24/21 Bi-rads 1 neg  BMD:   none Colonoscopy:06/20/20, normal. F/U in 10 years.   TDaP:  07/05/12  Gardasil: NA   reports that she has never smoked. She has never used smokeless tobacco. She reports current alcohol use. She reports that she does not use drugs.  Past Medical History:  Diagnosis Date   Anxiety    on meds   Chronic lower back pain    Depression    on meds   Endometriosis    GERD (gastroesophageal reflux disease)    hx of   Migraine without aura     Past Surgical History:  Procedure Laterality Date   APPENDECTOMY and treatment of endometriosis  2005   STRABISMUS SURGERY  1975   TMJ ARTHROSCOPY  1995   WISDOM TOOTH EXTRACTION      Current Outpatient Medications  Medication Sig Dispense Refill   ALPRAZolam (XANAX) 0.25 MG tablet TK 1-2 TS PO QHS  3   Armodafinil 200 MG TABS Take 1 tablet by mouth daily.     butalbital-acetaminophen-caffeine (FIORICET) 50-325-40 MG tablet Take 1-2 tablets by mouth every 6 (six) hours as needed.     clotrimazole-betamethasone (LOTRISONE) cream Apply 1 application topically as needed.     escitalopram (LEXAPRO) 20 MG tablet Take 20 mg by mouth daily.     ibuprofen (ADVIL) 200 MG tablet Take 200 mg by mouth every 6 (six) hours as needed.     lamoTRIgine (LAMICTAL) 150 MG tablet Take 150 mg by mouth daily.     zolpidem (AMBIEN) 10 MG tablet Take 10 mg by mouth at bedtime as needed for sleep.     Current Facility-Administered Medications  Medication Dose Route Frequency Provider Last Rate Last Admin   0.9 %  sodium chloride  infusion  500 mL Intravenous Once Gatha Mayer, MD        Family History  Problem Relation Age of Onset   Depression Father    Anxiety disorder Father    Lung cancer Father 63   Melanoma Father 60   Anxiety disorder Sister    Breast cancer Sister 50   Colon cancer Maternal Grandmother 55   Colon polyps Maternal Grandmother    Cancer Sister 14       melanoma   Anxiety disorder Maternal Grandfather    Esophageal cancer Neg Hx    Stomach cancer Neg Hx    Rectal cancer Neg Hx     Review of Systems  Exam:   There were no vitals taken for this visit.  Weight change: '@WEIGHTCHANGE'$ @ Height:      Ht Readings from Last 3 Encounters:  09/25/21 5' 4.5" (1.638 m)  07/04/21 5' 4.5" (1.638 m)  02/20/21 5' 4.5" (1.638 m)    General appearance: alert, cooperative and appears stated age Head: Normocephalic, without obvious abnormality, atraumatic Neck: no adenopathy, supple, symmetrical, trachea midline and thyroid {CHL AMB PHY EX THYROID NORM [phone]::"normal to inspection and palpation"} Lungs: clear to auscultation bilaterally Cardiovascular: regular rate and rhythm Breasts: {Exam; breast:13139::"normal appearance,  no masses or tenderness"} Abdomen: soft, non-tender; non distended,  no masses,  no organomegaly Extremities: extremities normal, atraumatic, no cyanosis or edema Skin: Skin color, texture, turgor normal. No rashes or lesions Lymph nodes: Cervical, supraclavicular, and axillary nodes normal. No abnormal inguinal nodes palpated Neurologic: Grossly normal   Pelvic: External genitalia:  no lesions              Urethra:  normal appearing urethra with no masses, tenderness or lesions              Bartholins and Skenes: normal                 Vagina: normal appearing vagina with normal color and discharge, no lesions              Cervix: {CHL AMB PHY EX CERVIX NORM DEFAULT:[phone]::"no lesions"}               Bimanual Exam:  Uterus:  {CHL AMB PHY EX UTERUS  NORM [phone]::"normal size, contour, position, consistency, mobility, non-tender"}              Adnexa: {CHL AMB PHY EX ADNEXA NO MASS [phone]::"no mass, fullness, tenderness"}               Rectovaginal: Confirms               Anus:  normal sphincter tone, no lesions  *** chaperoned for the exam.  A:  Well Woman with normal exam  P:

## 2022-02-26 ENCOUNTER — Ambulatory Visit: Payer: BC Managed Care – PPO | Admitting: Obstetrics and Gynecology

## 2022-02-28 ENCOUNTER — Ambulatory Visit: Payer: BC Managed Care – PPO | Admitting: Obstetrics and Gynecology

## 2022-04-04 NOTE — Progress Notes (Signed)
51 y.o. G32P1001 Married White or Caucasian Not Hispanic or Latino female here for annual exam.  She states that her last normal period was in December.  Cycles got irregular last October. Last real cycle was in 12/22. Spotting in June.   She has been having some hot flashes for 1-2 months, happening 3-4 x daily. She has been having night sweats for at least 6 months. Wakes up at least 1/2 of the time at least 1 x a night.  No vaginal dryness. Recently with discomfort with intercourse. Not using a lubricant.     No LMP recorded.          Sexually active: Yes.    The current method of family planning is condoms most of the time.    Exercising: Yes.     Walking  Smoker:  no  Health Maintenance: Pap:  02/20/21 WNL Hr HPV Neg,  11/21/2016 WNL NEG HPV History of abnormal Pap:  no MMG:  08/24/21 Bi-rads 1 neg  BMD:   n/a Colonoscopy: 06/20/20, normal. F/U in 10 years.   TDaP:  07/05/12  Gardasil: NA    reports that she has never smoked. She has never used smokeless tobacco. She reports current alcohol use. She reports that she does not use drugs. 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 Husband is a Pension scheme manager. Son is 64. He is a Equities trader at  [employer], [employer]. He plans to go to college and study Community education officer.   Past Medical History:  Diagnosis Date   Anxiety    on meds   Chronic lower back pain    Depression    on meds   Endometriosis    GERD (gastroesophageal reflux disease)    hx of   Migraine without aura     Past Surgical History:  Procedure Laterality Date   APPENDECTOMY and treatment of endometriosis  2005   STRABISMUS SURGERY  1975   TMJ ARTHROSCOPY  1995   WISDOM TOOTH EXTRACTION      Current Outpatient Medications  Medication Sig Dispense Refill   ALPRAZolam (XANAX) 0.25 MG tablet TK 1-2 TS PO QHS  3   Armodafinil 200 MG TABS Take 1 tablet by mouth daily.     butalbital-acetaminophen-caffeine (FIORICET) 50-325-40 MG tablet Take 1-2 tablets by  mouth every 6 (six) hours as needed.     clotrimazole-betamethasone (LOTRISONE) cream Apply 1 application topically as needed.     escitalopram (LEXAPRO) 20 MG tablet Take 20 mg by mouth daily.     ibuprofen (ADVIL) 200 MG tablet Take 200 mg by mouth every 6 (six) hours as needed.     lamoTRIgine (LAMICTAL) 150 MG tablet Take 150 mg by mouth daily.     zolpidem (AMBIEN) 10 MG tablet Take 10 mg by mouth at bedtime as needed for sleep.     Current Facility-Administered Medications  Medication Dose Route Frequency Provider Last Rate Last Admin   0.9 %  sodium chloride infusion  500 mL Intravenous Once Gatha Mayer, MD        Family History  Problem Relation Age of Onset   Depression Father    Anxiety disorder Father    Lung cancer Father 21   Melanoma Father 23   Anxiety disorder Sister    Breast cancer Sister 85   Colon cancer Maternal Grandmother 23   Colon polyps Maternal Grandmother    Cancer Sister 31       melanoma   Anxiety disorder Maternal  Grandfather    Esophageal cancer Neg Hx    Stomach cancer Neg Hx    Rectal cancer Neg Hx     Review of Systems  All other systems reviewed and are negative.   Exam:   BP 108/72   Pulse 88   Ht 5' 4.57" (1.64 m)   Wt 137 lb (62.1 kg)   SpO2 100%   BMI 23.10 kg/m   Weight change: '@WEIGHTCHANGE'$ @ Height:   Height: 5' 4.57" (164 cm)  Ht Readings from Last 3 Encounters:  04/11/22 5' 4.57" (1.64 m)  09/25/21 5' 4.5" (1.638 m)  07/04/21 5' 4.5" (1.638 m)    General appearance: alert, cooperative and appears stated age Head: Normocephalic, without obvious abnormality, atraumatic Neck: no adenopathy, supple, symmetrical, trachea midline and thyroid normal to inspection and palpation Lungs: clear to auscultation bilaterally Cardiovascular: regular rate and rhythm Breasts: normal appearance, no masses or tenderness Abdomen: soft, non-tender; non distended,  no masses,  no organomegaly Extremities: extremities normal, atraumatic,  no cyanosis or edema Skin: Skin color, texture, turgor normal. No rashes or lesions Lymph nodes: Cervical, supraclavicular, and axillary nodes normal. No abnormal inguinal nodes palpated Neurologic: Grossly normal   Pelvic: External genitalia:  no lesions              Urethra:  normal appearing urethra with no masses, tenderness or lesions              Bartholins and Skenes: normal                 Vagina: mildly atrophic appearing vagina with normal color and discharge, no lesions              Cervix: no lesions               Bimanual Exam:  Uterus:  normal size, contour, position, consistency, mobility, non-tender              Adnexa: no mass, fullness, tenderness               Rectovaginal: Confirms               Anus:  normal sphincter tone, no lesions  Gae Dry, CMA chaperoned for the exam.  1. Well woman exam Discussed breast self exam Discussed calcium and vit D intake Mammogram and colonoscopy are UTD  2. Abnormal uterine bleeding (AUB) Patient is perimenopausal. LMP in 12/22, then had spotting in June - US PELVIS TRANSVAGINAL NON-OB (TV ONLY); Future - Follicle stimulating hormone  3. Perimenopausal vasomotor symptoms - Follicle stimulating hormone  4. Vitamin D deficiency - VITAMIN D 25 Hydroxy (Vit-D Deficiency, Fractures)  5. Lipids abnormal Not fasting - Lipid panel

## 2022-04-11 ENCOUNTER — Ambulatory Visit (INDEPENDENT_AMBULATORY_CARE_PROVIDER_SITE_OTHER): Payer: BC Managed Care – PPO | Admitting: Obstetrics and Gynecology

## 2022-04-11 ENCOUNTER — Encounter: Payer: Self-pay | Admitting: Obstetrics and Gynecology

## 2022-04-11 VITALS — BP 108/72 | HR 88 | Ht 64.57 in | Wt 137.0 lb

## 2022-04-11 DIAGNOSIS — E559 Vitamin D deficiency, unspecified: Secondary | ICD-10-CM | POA: Diagnosis not present

## 2022-04-11 DIAGNOSIS — Z01419 Encounter for gynecological examination (general) (routine) without abnormal findings: Secondary | ICD-10-CM | POA: Diagnosis not present

## 2022-04-11 DIAGNOSIS — E785 Hyperlipidemia, unspecified: Secondary | ICD-10-CM | POA: Diagnosis not present

## 2022-04-11 DIAGNOSIS — E7889 Other lipoprotein metabolism disorders: Secondary | ICD-10-CM

## 2022-04-11 DIAGNOSIS — N939 Abnormal uterine and vaginal bleeding, unspecified: Secondary | ICD-10-CM

## 2022-04-11 DIAGNOSIS — N951 Menopausal and female climacteric states: Secondary | ICD-10-CM | POA: Diagnosis not present

## 2022-04-11 NOTE — Patient Instructions (Signed)
Menopause Menopause is the normal time of a woman's life when menstrual periods stop completely. It marks the natural end to a woman's ability to become pregnant. It can be defined as the absence of a menstrual period for 12 months without another medical cause. The transition to menopause (perimenopause) most often happens between the ages of 45 and 55, and can last for many years. During perimenopause, hormone levels change in your body, which can cause symptoms and affect your health. Menopause may increase your risk for: Weakened bones (osteoporosis), which causes fractures. Depression. Hardening and narrowing of the arteries (atherosclerosis), which can cause heart attacks and strokes. What are the causes? This condition is usually caused by a natural change in hormone levels that happens as you get older. The condition may also be caused by changes that are not natural, including: Surgery to remove both ovaries (surgical menopause). Side effects from some medicines, such as chemotherapy used to treat cancer (chemical menopause). What increases the risk? This condition is more likely to start at an earlier age if you have certain medical conditions or have undergone treatments, including: A tumor of the pituitary gland in the brain. A disease that affects the ovaries and hormones. Certain cancer treatments, such as chemotherapy or hormone therapy, or radiation therapy on the pelvis. Heavy smoking and excessive alcohol use. Family history of early menopause. This condition is also more likely to develop earlier in women who are very thin. What are the signs or symptoms? Symptoms of this condition include: Hot flashes. Irregular menstrual periods. Night sweats. Changes in feelings about sex. This could be a decrease in sex drive or an increased discomfort around your sexuality. Vaginal dryness and thinning of the vaginal walls. This may cause painful sex. Dryness of the skin and  development of wrinkles. Headaches. Problems sleeping (insomnia). Mood swings or irritability. Memory problems. Weight gain. Hair growth on the face and chest. Bladder infections or problems with urinating. How is this diagnosed? This condition is diagnosed based on your medical history, a physical exam, your age, your menstrual history, and your symptoms. Hormone tests may also be done. How is this treated? In some cases, no treatment is needed. You and your health care provider should make a decision together about whether treatment is necessary. Treatment will be based on your individual condition and preferences. Treatment for this condition focuses on managing symptoms. Treatment may include: Menopausal hormone therapy (MHT). Medicines to treat specific symptoms or complications. Acupuncture. Vitamin or herbal supplements. Before starting treatment, make sure to let your health care provider know if you have a personal or family history of these conditions: Heart disease. Breast cancer. Blood clots. Diabetes. Osteoporosis. Follow these instructions at home: Lifestyle Do not use any products that contain nicotine or tobacco, such as cigarettes, e-cigarettes, and chewing tobacco. If you need help quitting, ask your health care provider. Get at least 30 minutes of physical activity on 5 or more days each week. Avoid alcoholic and caffeinated beverages, as well as spicy foods. This may help prevent hot flashes. Get 7-8 hours of sleep each night. If you have hot flashes, try: Dressing in layers. Avoiding things that may trigger hot flashes, such as spicy food, warm places, or stress. Taking slow, deep breaths when a hot flash starts. Keeping a fan in your home and office. Find ways to manage stress, such as deep breathing, meditation, or journaling. Consider going to group therapy with other women who are having menopause symptoms. Ask your health care   provider about recommended  group therapy meetings. Eating and drinking  Eat a healthy, balanced diet that contains whole grains, lean protein, low-fat dairy, and plenty of fruits and vegetables. Your health care provider may recommend adding more soy to your diet. Foods that contain soy include tofu, tempeh, and soy milk. Eat plenty of foods that contain calcium and vitamin D for bone health. Items that are rich in calcium include low-fat milk, yogurt, beans, almonds, sardines, broccoli, and kale. Medicines Take over-the-counter and prescription medicines only as told by your health care provider. Talk with your health care provider before starting any herbal supplements. If prescribed, take vitamins and supplements as told by your health care provider. General instructions  Keep track of your menstrual periods, including: When they occur. How heavy they are and how long they last. How much time passes between periods. Keep track of your symptoms, noting when they start, how often you have them, and how long they last. Use vaginal lubricants or moisturizers to help with vaginal dryness and improve comfort during sex. Keep all follow-up visits. This is important. This includes any group therapy or counseling. Contact a health care provider if: You are still having menstrual periods after age 66. You have pain during sex. You have not had a period for 12 months and you develop vaginal bleeding. Get help right away if you have: Severe depression. Excessive vaginal bleeding. Pain when you urinate. A fast or irregular heartbeat (palpitations). Severe headaches. Abdominal pain or severe indigestion. Summary Menopause is a normal time of life when menstrual periods stop completely. It is usually defined as the absence of a menstrual period for 12 months without another medical cause. The transition to menopause (perimenopause) most often happens between the ages of 39 and 33 and can last for several years. Symptoms  can be managed through medicines, lifestyle changes, and complementary therapies such as acupuncture. Eat a balanced diet that is rich in nutrients to promote bone health and heart health and to manage symptoms during menopause. This information is not intended to replace advice given to you by your health care provider. Make sure you discuss any questions you have with your health care provider. Document Revised: 04/29/2020 Document Reviewed: 01/14/2020 Elsevier Patient Education  [place]   We recommended that you start or continue a regular exercise program for good health. Physical activity is anything that gets your body moving, some is better than none. The CDC recommends 150 minutes per week of Moderate-Intensity Aerobic Activity and 2 or more days of Muscle Strengthening Activity.  Benefits of exercise are limitless: helps weight loss/weight maintenance, improves mood and energy, helps with depression and anxiety, improves sleep, tones and strengthens muscles, improves balance, improves bone density, protects from chronic conditions such as heart disease, high blood pressure and diabetes and so much more. To learn more visit: WhyNotPoker.uy  DIET: Good nutrition starts with a healthy diet of fruits, vegetables, whole grains, and lean protein sources. Drink plenty of water for hydration. Minimize empty calories, sodium, sweets. For more information about dietary recommendations visit: GeekRegister.com.ee and http://schaefer-mitchell.com/  ALCOHOL:  Women should limit their alcohol intake to no more than 7 drinks/beers/glasses of wine (combined, not each!) per week. Moderation of alcohol intake to this level decreases your risk of breast cancer and liver damage.  If you are concerned that you may have a problem, or your friends have told you they are concerned about your drinking, there are  many resources to  help. A well-known program that is free, effective, and available to all people all over the nation is Alcoholics Anonymous.  Check out this site to learn more: BlockTaxes.se   CALCIUM AND VITAMIN D:  Adequate intake of calcium and Vitamin D are recommended for bone health.  You should be getting between 1000-1200 mg of calcium and 800 units of Vitamin D daily between diet and supplements  PAP SMEARS:  Pap smears, to check for cervical cancer or precancers,  have traditionally been done yearly, scientific advances have shown that most women can have pap smears less often.  However, every woman still should have a physical exam from her gynecologist every year. It will include a breast check, inspection of the vulva and vagina to check for abnormal growths or skin changes, a visual exam of the cervix, and then an exam to evaluate the size and shape of the uterus and ovaries. We will also provide age appropriate advice regarding health maintenance, like when you should have certain vaccines, screening for sexually transmitted diseases, bone density testing, colonoscopy, mammograms, etc.   MAMMOGRAMS:  All women over 63 years old should have a routine mammogram.   COLON CANCER SCREENING: Now recommend starting at age 45. At this time colonoscopy is not covered for routine screening until 50. There are take home tests that can be done between 45-49.   COLONOSCOPY:  Colonoscopy to screen for colon cancer is recommended for all women at age 77.  We know, you hate the idea of the prep.  We agree, BUT, having colon cancer and not knowing it is worse!!  Colon cancer so often starts as a polyp that can be seen and removed at colonscopy, which can quite literally save your life!  And if your first colonoscopy is normal and you have no family history of colon cancer, most women don't have to have it again for 10 years.  Once every ten years, you can do something that may end up saving your  life, right?  We will be happy to help you get it scheduled when you are ready.  Be sure to check your insurance coverage so you understand how much it will cost.  It may be covered as a preventative service at no cost, but you should check your particular policy.      Breast Self-Awareness Breast self-awareness means being familiar with how your breasts look and feel. It involves checking your breasts regularly and reporting any changes to your health care provider. Practicing breast self-awareness is important. A change in your breasts can be a sign of a serious medical problem. Being familiar with how your breasts look and feel allows you to find any problems early, when treatment is more likely to be successful. All women should practice breast self-awareness, including women who have had breast implants. How to do a breast self-exam One way to learn what is normal for your breasts and whether your breasts are changing is to do a breast self-exam. To do a breast self-exam: Look for Changes  Remove all the clothing above your waist. Stand in front of a mirror in a room with good lighting. Put your hands on your hips. Push your hands firmly downward. Compare your breasts in the mirror. Look for differences between them (asymmetry), such as: Differences in shape. Differences in size. Puckers, dips, and bumps in one breast and not the other. Look at each breast for changes in your skin, such as: Redness. Scaly areas. Look for changes  in your nipples, such as: Discharge. Bleeding. Dimpling. Redness. A change in position. Feel for Changes Carefully feel your breasts for lumps and changes. It is best to do this while lying on your back on the floor and again while sitting or standing in the shower or tub with soapy water on your skin. Feel each breast in the following way: Place the arm on the side of the breast you are examining above your head. Feel your breast with the other  hand. Start in the nipple area and make  inch (2 cm) overlapping circles to feel your breast. Use the pads of your three middle fingers to do this. Apply light pressure, then medium pressure, then firm pressure. The light pressure will allow you to feel the tissue closest to the skin. The medium pressure will allow you to feel the tissue that is a little deeper. The firm pressure will allow you to feel the tissue close to the ribs. Continue the overlapping circles, moving downward over the breast until you feel your ribs below your breast. Move one finger-width toward the center of the body. Continue to use the  inch (2 cm) overlapping circles to feel your breast as you move slowly up toward your collarbone. Continue the up and down exam using all three pressures until you reach your armpit.  Write Down What You Find  Write down what is normal for each breast and any changes that you find. Keep a written record with breast changes or normal findings for each breast. By writing this information down, you do not need to depend only on memory for size, tenderness, or location. Write down where you are in your menstrual cycle, if you are still menstruating. If you are having trouble noticing differences in your breasts, do not get discouraged. With time you will become more familiar with the variations in your breasts and more comfortable with the exam. How often should I examine my breasts? Examine your breasts every month. If you are breastfeeding, the best time to examine your breasts is after a feeding or after using a breast pump. If you menstruate, the best time to examine your breasts is 5-7 days after your period is over. During your period, your breasts are lumpier, and it may be more difficult to notice changes. When should I see my health care provider? See your health care provider if you notice: A change in shape or size of your breasts or nipples. A change in the skin of your breast or  nipples, such as a reddened or scaly area. Unusual discharge from your nipples. A lump or thick area that was not there before. Pain in your breasts. Anything that concerns you.

## 2022-04-12 LAB — FOLLICLE STIMULATING HORMONE: FSH: 145.4 m[IU]/mL — ABNORMAL HIGH

## 2022-04-12 LAB — LIPID PANEL
Cholesterol: 251 mg/dL — ABNORMAL HIGH (ref <200)
HDL: 74 mg/dL (ref >=50)
LDL Cholesterol (Calc): 159 mg/dL (calc) — ABNORMAL HIGH
Non-HDL Cholesterol (Calc): 177 mg/dL (calc) — ABNORMAL HIGH (ref <130)
Total CHOL/HDL Ratio: 3.4 (calc) (ref <5.0)
Triglycerides: 76 mg/dL (ref <150)

## 2022-04-12 LAB — VITAMIN D 25 HYDROXY (VIT D DEFICIENCY, FRACTURES): Vit D, 25-Hydroxy: 46 ng/mL (ref 30–100)

## 2022-04-17 ENCOUNTER — Other Ambulatory Visit: Payer: Self-pay

## 2022-04-17 ENCOUNTER — Other Ambulatory Visit: Payer: Self-pay | Admitting: *Deleted

## 2022-04-17 ENCOUNTER — Other Ambulatory Visit (INDEPENDENT_AMBULATORY_CARE_PROVIDER_SITE_OTHER): Payer: BC Managed Care – PPO

## 2022-04-17 ENCOUNTER — Encounter: Payer: Self-pay | Admitting: Obstetrics and Gynecology

## 2022-04-17 ENCOUNTER — Ambulatory Visit (INDEPENDENT_AMBULATORY_CARE_PROVIDER_SITE_OTHER): Payer: BC Managed Care – PPO | Admitting: Obstetrics and Gynecology

## 2022-04-17 DIAGNOSIS — N951 Menopausal and female climacteric states: Secondary | ICD-10-CM | POA: Diagnosis not present

## 2022-04-17 DIAGNOSIS — N939 Abnormal uterine and vaginal bleeding, unspecified: Secondary | ICD-10-CM | POA: Diagnosis not present

## 2022-04-17 MED ORDER — MEDROXYPROGESTERONE ACETATE 5 MG PO TABS: ORAL_TABLET | ORAL | 1 refills | Status: DC

## 2022-04-17 NOTE — Progress Notes (Signed)
GYNECOLOGY  VISIT   HPI: 51 y.o.   Married White or Caucasian Not Hispanic or Latino  female   G1P1001 with No LMP recorded.   here for evaluation of AUB. Cycles got irregular last October. Last real cycle was in 12/22. Spotting in June.   GYNECOLOGIC HISTORY: No LMP recorded. Contraception: condoms Menopausal hormone therapy: no        OB History     Gravida  1   Para  1   Term  1   Preterm      AB      Living  1      SAB      IAB      Ectopic      Multiple      Live Births                 Patient Active Problem List   Diagnosis Date Noted   Postviral fatigue syndrome 09/06/2021   Delayed sleep phase syndrome 09/06/2021   Idiopathic hypotension 09/06/2021   Ageusia 09/06/2021   COVID-19 long hauler manifesting chronic concentration deficit 09/06/2021   Anosmia 09/06/2021   Non-restorative sleep 09/06/2021   Lateral epicondylitis of right elbow 08/30/2021   Right elbow pain 08/30/2021   Endometriosis    Depression    Migraine without aura    Pain in thoracic spine 01/24/2010    Past Medical History:  Diagnosis Date   Anxiety    on meds   Chronic lower back pain    Depression    on meds   Endometriosis    GERD (gastroesophageal reflux disease)    hx of   Migraine without aura     Past Surgical History:  Procedure Laterality Date   APPENDECTOMY and treatment of endometriosis  2005   STRABISMUS SURGERY  1975   TMJ ARTHROSCOPY  1995   WISDOM TOOTH EXTRACTION      Current Outpatient Medications  Medication Sig Dispense Refill   ALPRAZolam (XANAX) 0.25 MG tablet TK 1-2 TS PO QHS  3   Armodafinil 200 MG TABS Take 1 tablet by mouth daily.     butalbital-acetaminophen-caffeine (FIORICET) 50-325-40 MG tablet Take 1-2 tablets by mouth every 6 (six) hours as needed.     clotrimazole-betamethasone (LOTRISONE) cream Apply 1 application topically as needed.     escitalopram (LEXAPRO) 20 MG tablet Take 20 mg by mouth daily.     ibuprofen (ADVIL)  200 MG tablet Take 200 mg by mouth every 6 (six) hours as needed.     lamoTRIgine (LAMICTAL) 150 MG tablet Take 150 mg by mouth daily.     zolpidem (AMBIEN) 10 MG tablet Take 10 mg by mouth at bedtime as needed for sleep.     Current Facility-Administered Medications  Medication Dose Route Frequency Provider Last Rate Last Admin   0.9 %  sodium chloride infusion  500 mL Intravenous Once Gatha Mayer, MD         ALLERGIES: Bee venom  Family History  Problem Relation Age of Onset   Depression Father    Anxiety disorder Father    Lung cancer Father 10   Melanoma Father 87   Anxiety disorder Sister    Breast cancer Sister 66   Colon cancer Maternal Grandmother 78   Colon polyps Maternal Grandmother    Cancer Sister 48       melanoma   Anxiety disorder Maternal Grandfather    Esophageal cancer Neg Hx    Stomach cancer  Neg Hx    Rectal cancer Neg Hx     Social History   Socioeconomic History   Marital status: Married    Spouse name: Not on file   Number of children: Not on file   Years of education: Not on file   Highest education level: Not on file  Occupational History   Not on file  Tobacco Use   Smoking status: Never   Smokeless tobacco: Never  Vaping Use   Vaping Use: Never used  Substance and Sexual Activity   Alcohol use: Yes    Alcohol/week: 0.0 - 1.0 standard drinks of alcohol    Comment: rarely   Drug use: No   Sexual activity: Yes    Partners: Male    Birth control/protection: Condom    Comment: condoms  Other Topics Concern   Not on file  Social History Narrative   Not on file   Social Determinants of Health   Financial Resource Strain: Not on file  Food Insecurity: Not on file  Transportation Needs: Not on file  Physical Activity: Not on file  Stress: Not on file  Social Connections: Not on file  Intimate Partner Violence: Not on file    ROS  PHYSICAL EXAMINATION:    There were no vitals taken for this visit.    General appearance:  alert, cooperative and appears stated age  See u/s report. I was present during the ultrasound, thin endometrium.  1. Abnormal uterine bleeding (AUB) - US PELVIS TRANSVAGINAL NON-OB (TV ONLY): thin stripe - US Transvaginal Non-OB - medroxyPROGESTERone (PROVERA) 5 MG tablet; Take one tablet a day for 5 days now and every other month until you go 6 months without any bleeding.  Dispense: 15 tablet; Refill: 1 -calendar all bleeding, call if bleeding other than after taking the provera  2. Perimenopausal - medroxyPROGESTERone (PROVERA) 5 MG tablet; Take one tablet a day for 5 days now and every other month until you go 6 months without any bleeding.  Dispense: 15 tablet; Refill: 1

## 2022-04-17 NOTE — Patient Instructions (Signed)
Take one provera tablet a day for 5 days now and every other month until you go 6 months without any bleeding.   You may bleed after taking the provera, typically this will happen within 1-2 weeks of stopping the provera.  Calendar all bleeding. If you bleed outside of taking the provera, please reach out.

## 2022-05-16 DIAGNOSIS — F331 Major depressive disorder, recurrent, moderate: Secondary | ICD-10-CM | POA: Diagnosis not present

## 2022-05-16 DIAGNOSIS — G47419 Narcolepsy without cataplexy: Secondary | ICD-10-CM | POA: Diagnosis not present

## 2022-05-16 DIAGNOSIS — F411 Generalized anxiety disorder: Secondary | ICD-10-CM | POA: Diagnosis not present

## 2022-06-14 ENCOUNTER — Telehealth: Payer: Self-pay | Admitting: *Deleted

## 2022-06-14 NOTE — Telephone Encounter (Signed)
[facility] called stating patient Rx for Provera 5 mg was transferred to them from [facility].  [facility] wanted to clarify direction are take one tablet daily for 5 day every other month until no cycle for 6 months.  I confirmed directions with pharmacy tech.

## 2022-07-11 DIAGNOSIS — R051 Acute cough: Secondary | ICD-10-CM | POA: Diagnosis not present

## 2022-07-11 DIAGNOSIS — R490 Dysphonia: Secondary | ICD-10-CM | POA: Diagnosis not present

## 2022-07-25 DIAGNOSIS — F411 Generalized anxiety disorder: Secondary | ICD-10-CM | POA: Diagnosis not present

## 2022-07-25 DIAGNOSIS — G47419 Narcolepsy without cataplexy: Secondary | ICD-10-CM | POA: Diagnosis not present

## 2022-07-25 DIAGNOSIS — F331 Major depressive disorder, recurrent, moderate: Secondary | ICD-10-CM | POA: Diagnosis not present

## 2022-09-07 ENCOUNTER — Other Ambulatory Visit: Payer: Self-pay | Admitting: Obstetrics and Gynecology

## 2022-09-07 DIAGNOSIS — Z1231 Encounter for screening mammogram for malignant neoplasm of breast: Secondary | ICD-10-CM

## 2022-09-21 ENCOUNTER — Ambulatory Visit
Admission: RE | Admit: 2022-09-21 | Discharge: 2022-09-21 | Disposition: A | Discharge status: Home / Self Care | Payer: BC Managed Care – PPO | Source: Ambulatory Visit | Attending: Obstetrics and Gynecology | Admitting: Obstetrics and Gynecology

## 2022-09-21 DIAGNOSIS — Z1231 Encounter for screening mammogram for malignant neoplasm of breast: Secondary | ICD-10-CM | POA: Diagnosis not present

## 2022-10-05 DIAGNOSIS — E785 Hyperlipidemia, unspecified: Secondary | ICD-10-CM | POA: Diagnosis not present

## 2022-10-05 DIAGNOSIS — E559 Vitamin D deficiency, unspecified: Secondary | ICD-10-CM | POA: Diagnosis not present

## 2022-10-12 DIAGNOSIS — Z0000 Encounter for general adult medical examination without abnormal findings: Secondary | ICD-10-CM | POA: Diagnosis not present

## 2022-10-12 DIAGNOSIS — Z23 Encounter for immunization: Secondary | ICD-10-CM | POA: Diagnosis not present

## 2022-10-12 DIAGNOSIS — U099 Post covid-19 condition, unspecified: Secondary | ICD-10-CM | POA: Diagnosis not present

## 2022-10-25 DIAGNOSIS — G47419 Narcolepsy without cataplexy: Secondary | ICD-10-CM | POA: Diagnosis not present

## 2022-10-25 DIAGNOSIS — F411 Generalized anxiety disorder: Secondary | ICD-10-CM | POA: Diagnosis not present

## 2022-10-25 DIAGNOSIS — F331 Major depressive disorder, recurrent, moderate: Secondary | ICD-10-CM | POA: Diagnosis not present

## 2023-01-23 DIAGNOSIS — F411 Generalized anxiety disorder: Secondary | ICD-10-CM | POA: Diagnosis not present

## 2023-01-23 DIAGNOSIS — F331 Major depressive disorder, recurrent, moderate: Secondary | ICD-10-CM | POA: Diagnosis not present

## 2023-01-23 DIAGNOSIS — G47419 Narcolepsy without cataplexy: Secondary | ICD-10-CM | POA: Diagnosis not present

## 2023-03-18 IMAGING — MG MM DIGITAL SCREENING BILAT W/ TOMO AND CAD
6 of 10 series · 6 of 30 positions shown · non-contrast
Comparison: Previous exam(s).

CLINICAL DATA: Screening.

EXAM:
DIGITAL SCREENING BILATERAL MAMMOGRAM WITH TOMOSYNTHESIS AND CAD
TECHNIQUE: Bilateral screening digital craniocaudal and mediolateral oblique
mammograms were obtained. Bilateral screening digital breast
tomosynthesis was performed. The images were evaluated with
computer-aided detection.

[L MLO synth-2D]
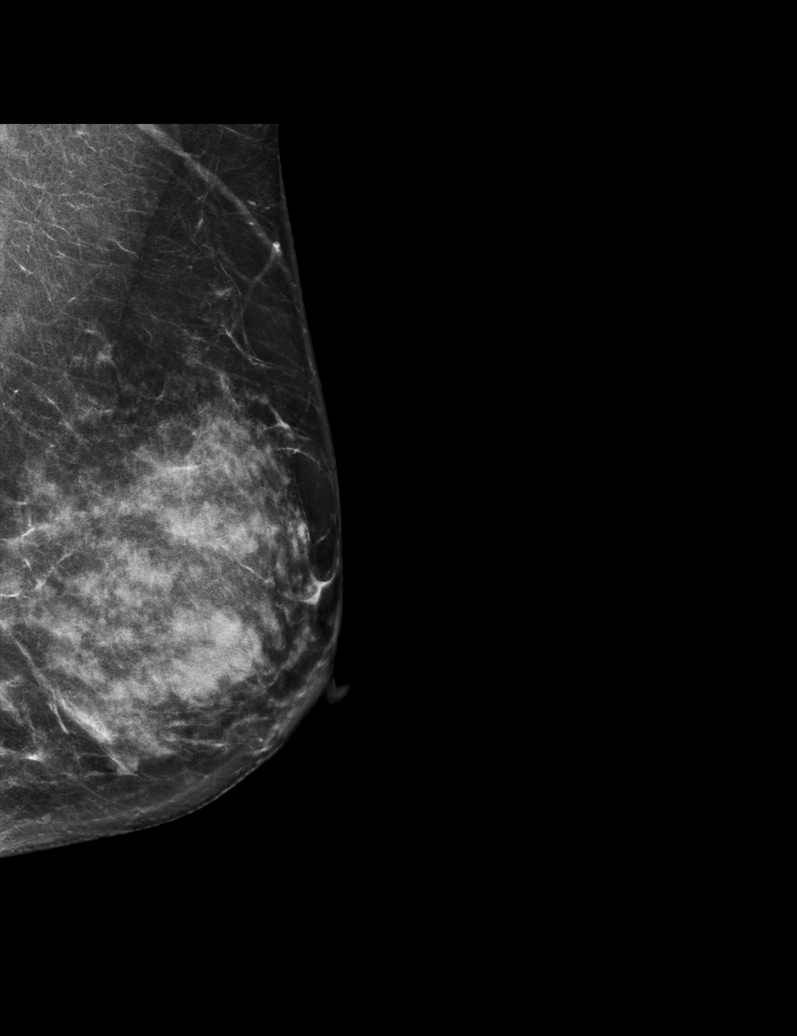

[R CC synth-2D]
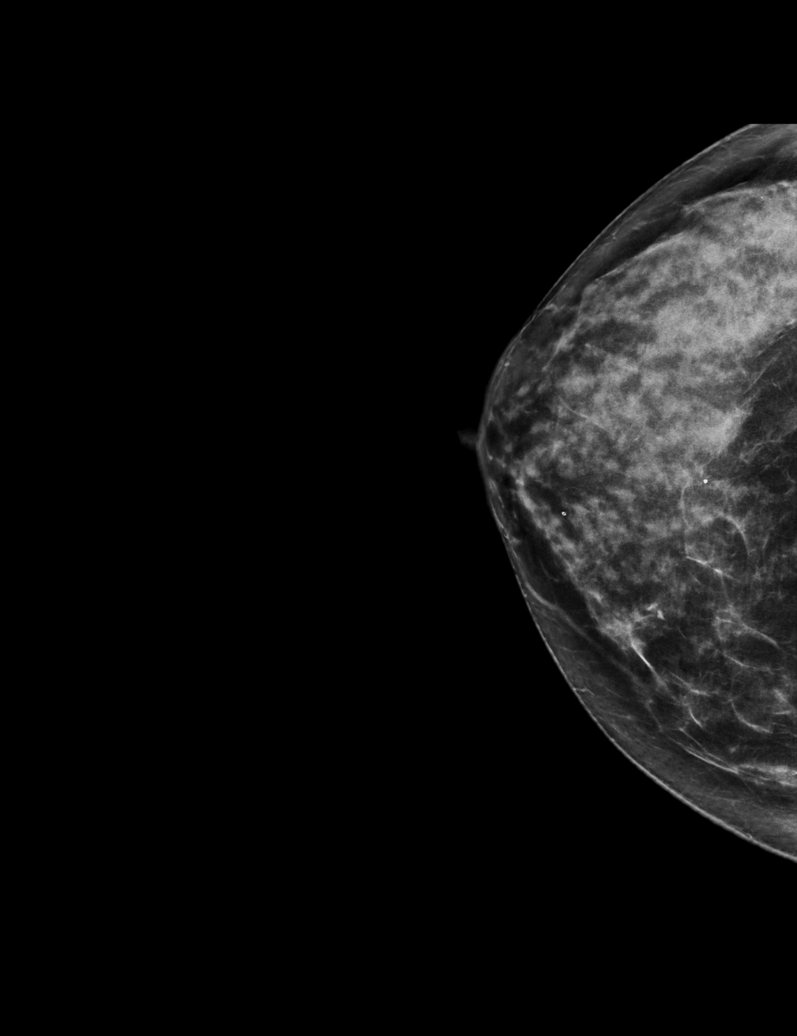

[R XCCL synth-2D]
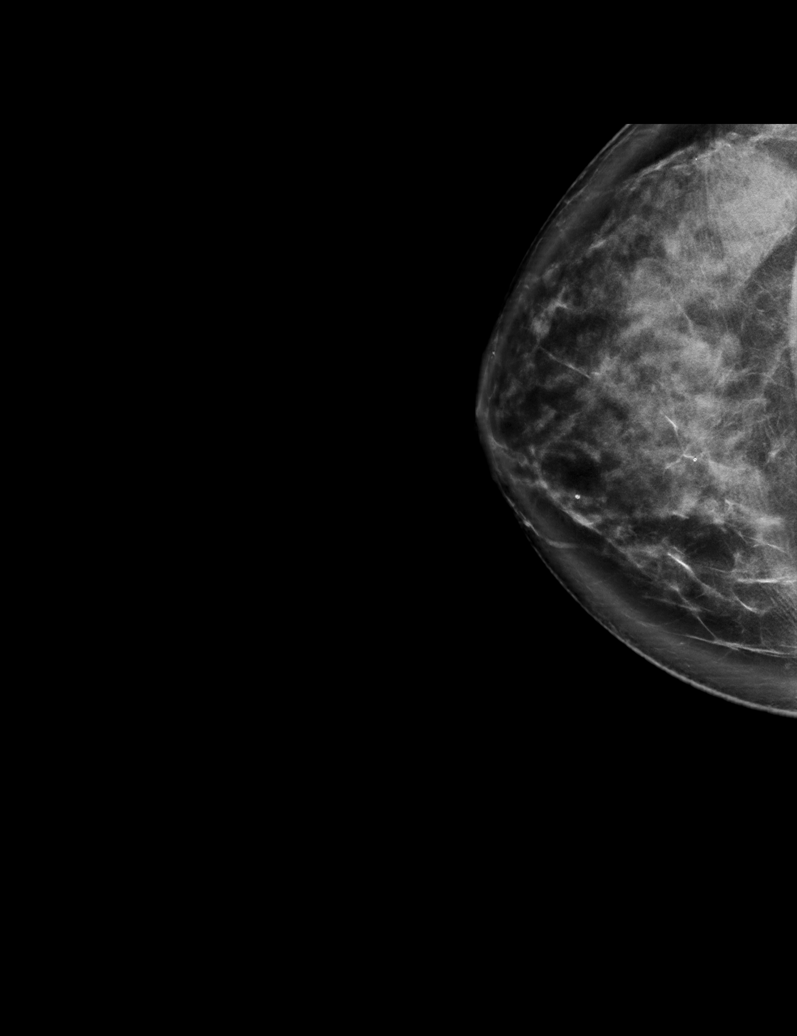

[R MLO synth-2D]
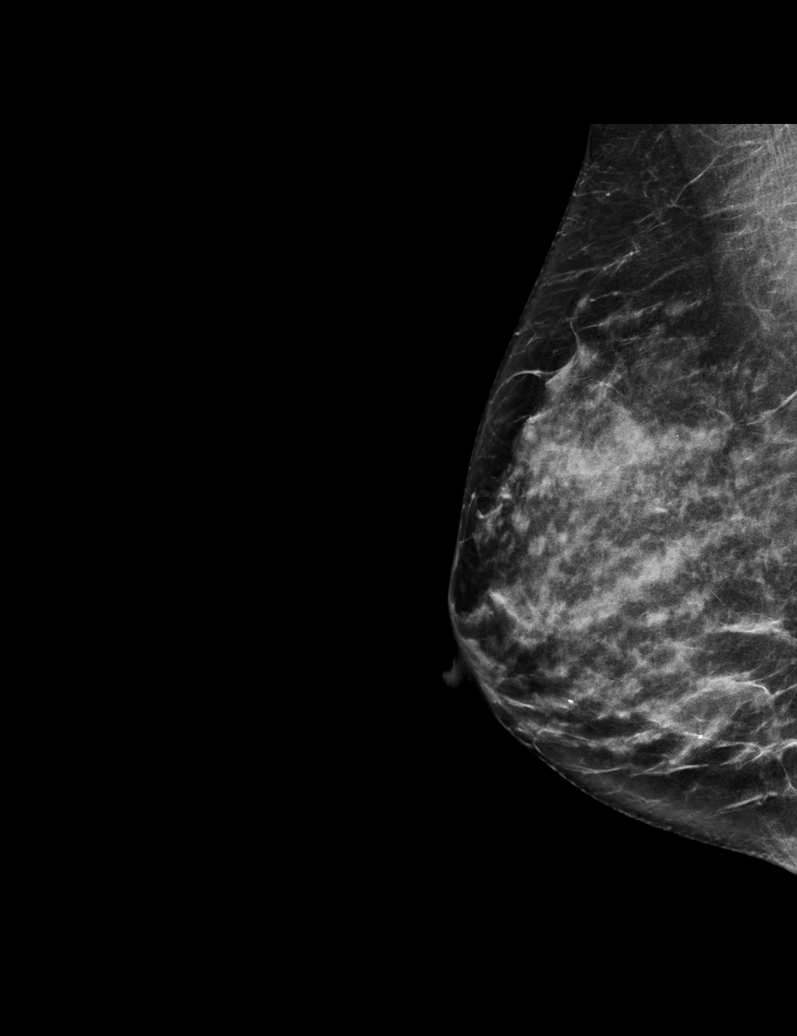

[L CC synth-2D]
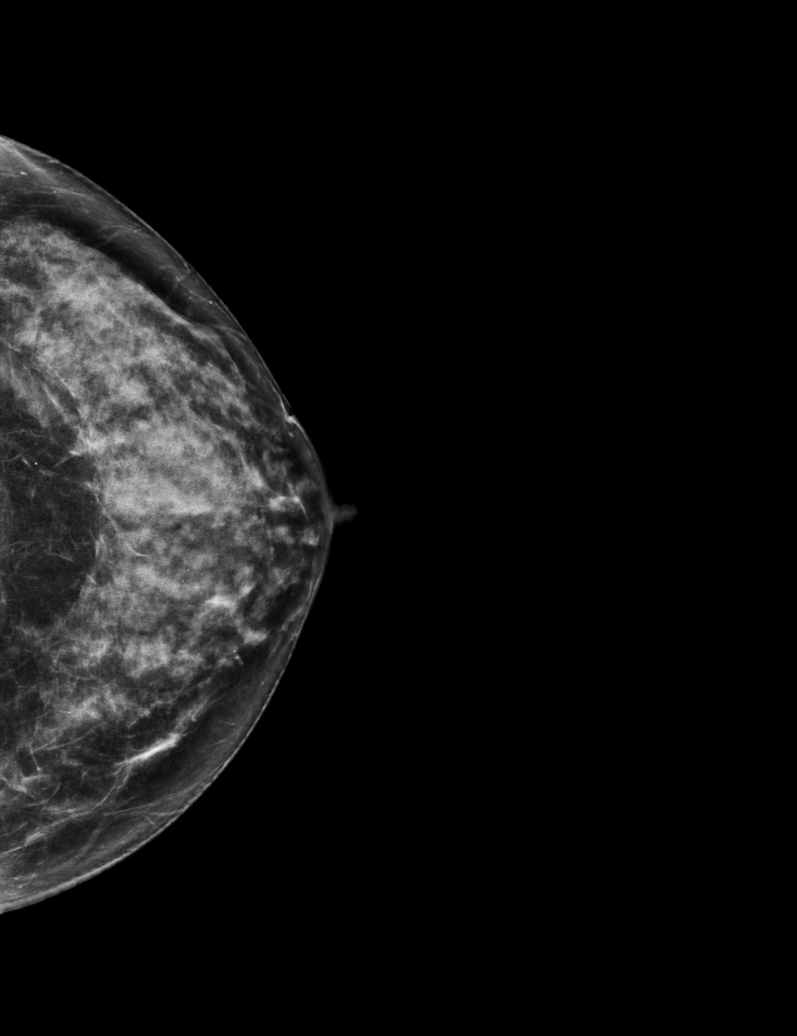

[R XCCL tomo · tomo slice 41/80.0]
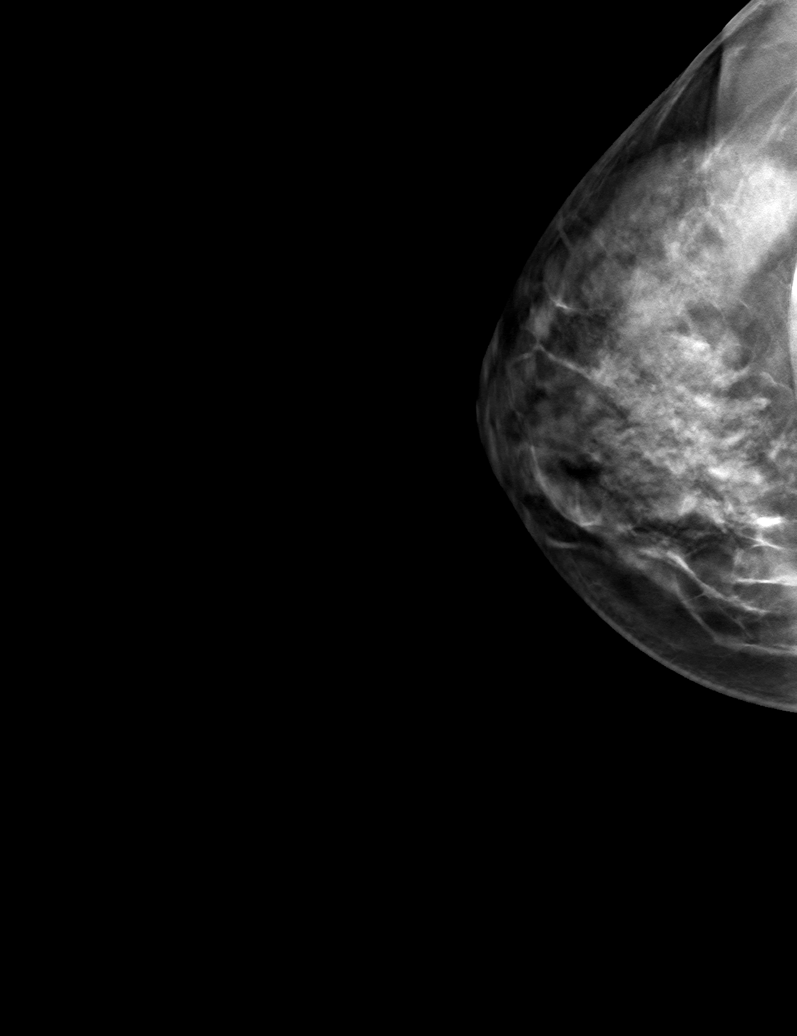

[6 of 30 positions shown; findings below may reference images not displayed]

ACR Breast Density Category c: The breast tissue is heterogeneously
dense, which may obscure small masses.
FINDINGS: There are no findings suspicious for malignancy.
IMPRESSION: No mammographic evidence of malignancy. A result letter of this
screening mammogram will be mailed directly to the patient.

RECOMMENDATION:
Screening mammogram in one year. (Code:[id number])

BI-RADS CATEGORY  1: Negative.

## 2023-03-20 DIAGNOSIS — D2271 Melanocytic nevi of right lower limb, including hip: Secondary | ICD-10-CM | POA: Diagnosis not present

## 2023-03-20 DIAGNOSIS — L821 Other seborrheic keratosis: Secondary | ICD-10-CM | POA: Diagnosis not present

## 2023-03-20 DIAGNOSIS — L738 Other specified follicular disorders: Secondary | ICD-10-CM | POA: Diagnosis not present

## 2023-03-20 DIAGNOSIS — L814 Other melanin hyperpigmentation: Secondary | ICD-10-CM | POA: Diagnosis not present

## 2023-04-17 ENCOUNTER — Ambulatory Visit: Payer: BC Managed Care – PPO | Admitting: Obstetrics and Gynecology

## 2023-04-17 DIAGNOSIS — G47419 Narcolepsy without cataplexy: Secondary | ICD-10-CM | POA: Diagnosis not present

## 2023-04-17 DIAGNOSIS — F331 Major depressive disorder, recurrent, moderate: Secondary | ICD-10-CM | POA: Diagnosis not present

## 2023-04-17 DIAGNOSIS — F411 Generalized anxiety disorder: Secondary | ICD-10-CM | POA: Diagnosis not present

## 2023-05-15 ENCOUNTER — Encounter: Payer: Self-pay | Admitting: Radiology

## 2023-05-15 ENCOUNTER — Ambulatory Visit (INDEPENDENT_AMBULATORY_CARE_PROVIDER_SITE_OTHER): Payer: BC Managed Care – PPO | Admitting: Radiology

## 2023-05-15 VITALS — BP 112/62 | Ht 64.25 in | Wt 135.0 lb

## 2023-05-15 DIAGNOSIS — Z01419 Encounter for gynecological examination (general) (routine) without abnormal findings: Secondary | ICD-10-CM | POA: Diagnosis not present

## 2023-05-15 DIAGNOSIS — N958 Other specified menopausal and perimenopausal disorders: Secondary | ICD-10-CM

## 2023-05-15 DIAGNOSIS — N951 Menopausal and female climacteric states: Secondary | ICD-10-CM | POA: Diagnosis not present

## 2023-05-15 DIAGNOSIS — N959 Unspecified menopausal and perimenopausal disorder: Secondary | ICD-10-CM

## 2023-05-15 MED ORDER — IMVEXXY MAINTENANCE PACK 10 MCG VA INST: 1.0000 | VAGINAL_INSERT | VAGINAL | 11 refills | Status: DC

## 2023-05-15 MED ORDER — ESTRADIOL 0.025 MG/24HR TD PTTW: 1.0000 | MEDICATED_PATCH | TRANSDERMAL | 0 refills | Status: DC

## 2023-05-15 MED ORDER — PROGESTERONE MICRONIZED 100 MG PO CAPS: 100.0000 mg | ORAL_CAPSULE | Freq: Every day | ORAL | 0 refills | Status: DC

## 2023-05-15 NOTE — Progress Notes (Signed)
   Whitney Guzman 20-Dec-1970 [id number]   History: Postmenopausal 52 y.o. presents for annual exam. C/o Menopause symptoms, LMP: over 1 year ago, night sweats, hot flashes, trouble sleeping, vaginal dryness, painful intercourse, "fuzzy memory, distractibility". Mother has dementia. Has labs with PCP. Up to date on screenings.   Gynecologic History Postmenopausal Last Pap: 2022. Results were: normal Last mammogram: 2024. Results were: normal Last colonoscopy: 2021   Obstetric History OB History  Gravida Para Term Preterm AB Living  1 1 1     1   SAB IAB Ectopic Multiple Live Births               # Outcome Date GA Lbr Len/2nd Weight Sex Type Anes PTL Lv  1 Term              The following portions of the patient's history were reviewed and updated as appropriate: allergies, current medications, past family history, past medical history, past social history, past surgical history, and problem list.  Review of Systems Pertinent items noted in HPI and remainder of comprehensive ROS otherwise negative.  Past medical history, past surgical history, family history and social history were all reviewed and documented in the EPIC chart.  Exam:  Vitals:   05/15/23 1426  BP: 112/62  Weight: 135 lb (61.2 kg)  Height: 5' 4.25" (1.632 m)   Body mass index is 22.99 kg/m.  General appearance:  Normal Thyroid:  Symmetrical, normal in size, without palpable masses or nodularity. Respiratory  Auscultation:  Clear without wheezing or rhonchi Cardiovascular  Auscultation:  Regular rate, without rubs, murmurs or gallops  Edema/varicosities:  Not grossly evident Abdominal  Soft,nontender, without masses, guarding or rebound.  Liver/spleen:  No organomegaly noted  Hernia:  None appreciated  Skin  Inspection:  Grossly normal Breasts: Examined lying and sitting.   Right: Without masses, retractions, nipple discharge or axillary adenopathy.   Left: Without masses, retractions, nipple  discharge or axillary adenopathy. Genitourinary   Inguinal/mons:  Normal without inguinal adenopathy  External genitalia:  Normal appearing vulva with no masses, tenderness, or lesions  BUS/Urethra/Skene's glands:  Normal  Vagina:  Normal appearing with normal color and discharge, no lesions. Atrophy: moderate   Cervix:  Normal appearing without discharge or lesions  Uterus:  Normal in size, shape and contour.  Midline and mobile, nontender  Adnexa/parametria:     Rt: Normal in size, without masses or tenderness.   Lt: Normal in size, without masses or tenderness.  Anus and perineum: Normal    Raynelle Fanning, CMA present for exam  Assessment/Plan:   1. Well woman exam with routine gynecological exam Pap 2025  2. Menopausal disorder Discussed options for treatment including risks and benefits - estradiol (VIVELLE-DOT) 0.025 MG/24HR; Place 1 patch onto the skin 2 (two) times a week.  Dispense: 24 patch; Refill: 0 - progesterone (PROMETRIUM) 100 MG capsule; Take 1 capsule (100 mg total) by mouth daily.  Dispense: 90 capsule; Refill: 0  3. Genitourinary syndrome of menopause - Estradiol (IMVEXXY MAINTENANCE PACK) 10 MCG INST; Place 1 tablet vaginally 2 (two) times a week.  Dispense: 8 each; Refill: 11    Discussed SBE, colonoscopy and DEXA screening as directed. Recommend of exercise weekly, including weight bearing exercise. Encouraged the use of seatbelts and sunscreen.  Follow up in 3 months for med check  Arlie Solomons B WHNP-BC, 2:51 PM 05/15/2023

## 2023-07-17 DIAGNOSIS — F411 Generalized anxiety disorder: Secondary | ICD-10-CM | POA: Diagnosis not present

## 2023-07-17 DIAGNOSIS — G47419 Narcolepsy without cataplexy: Secondary | ICD-10-CM | POA: Diagnosis not present

## 2023-07-17 DIAGNOSIS — F331 Major depressive disorder, recurrent, moderate: Secondary | ICD-10-CM | POA: Diagnosis not present

## 2023-08-13 ENCOUNTER — Encounter: Payer: Self-pay | Admitting: Radiology

## 2023-08-13 ENCOUNTER — Ambulatory Visit (INDEPENDENT_AMBULATORY_CARE_PROVIDER_SITE_OTHER): Payer: BC Managed Care – PPO | Admitting: Radiology

## 2023-08-13 DIAGNOSIS — N958 Other specified menopausal and perimenopausal disorders: Secondary | ICD-10-CM

## 2023-08-13 DIAGNOSIS — N959 Unspecified menopausal and perimenopausal disorder: Secondary | ICD-10-CM

## 2023-08-13 DIAGNOSIS — N951 Menopausal and female climacteric states: Secondary | ICD-10-CM

## 2023-08-13 MED ORDER — PROGESTERONE MICRONIZED 100 MG PO CAPS: 100.0000 mg | ORAL_CAPSULE | Freq: Every day | ORAL | 4 refills | Status: DC

## 2023-08-13 MED ORDER — ESTRADIOL 0.025 MG/24HR TD PTTW: 1.0000 | MEDICATED_PATCH | TRANSDERMAL | 4 refills | Status: DC

## 2023-08-13 MED ORDER — IMVEXXY MAINTENANCE PACK 10 MCG VA INST: 1.0000 | VAGINAL_INSERT | VAGINAL | 11 refills | Status: DC

## 2023-08-13 NOTE — Progress Notes (Signed)
   Whitney Guzman 31-May-1971 [id number]   History: Postmenopausal 52 y.o. presents for follow up after using HRT and vaginal estrogen x 3 months. Hot flashes have significantly improved, sex is more comfortable, overall happy with treatment plan.   Gynecologic History Postmenopausal Last Pap: 2022. Results were: normal Last mammogram: 09/2022. Results were: normal   Obstetric History OB History  Gravida Para Term Preterm AB Living  1 1 1   1   SAB IAB Ectopic Multiple Live Births          # Outcome Date GA Lbr Len/2nd Weight Sex Type Anes PTL Lv  1 Term              The following portions of the patient's history were reviewed and updated as appropriate: allergies, current medications, past family history, past medical history, past social history, past surgical history, and problem list.  Review of Systems Pertinent items noted in HPI and remainder of comprehensive ROS otherwise negative.  Past medical history, past surgical history, family history and social history were all reviewed and documented in the EPIC chart.  Exam:  Vitals:   08/13/23 1505  BP: 114/64  Pulse: 63  SpO2: 90%  Weight: 138 lb (62.6 kg)   Body mass index is 23.5 kg/m.  Physical Exam Vitals and nursing note reviewed.  Constitutional:      Appearance: Normal appearance.  Pulmonary:     Effort: Pulmonary effort is normal.  Neurological:     Mental Status: She is alert.  Psychiatric:        Mood and Affect: Mood normal.        Thought Content: Thought content normal.        Judgment: Judgment normal.     Assessment/Plan:   1. Menopausal disorder - estradiol  (VIVELLE -DOT) 0.025 MG/24HR; Place 1 patch onto the skin 2 (two) times a week.  Dispense: 24 patch; Refill: 4 - progesterone  (PROMETRIUM ) 100 MG capsule; Take 1 capsule (100 mg total) by mouth daily.  Dispense: 90 capsule; Refill: 4  2. Genitourinary syndrome of menopause - Estradiol  (IMVEXXY  MAINTENANCE PACK) 10 MCG INST; Place 1  tablet vaginally 2 (two) times a week.  Dispense: 8 each; Refill: 11   Follow up 04/2024 for AEX or sooner prn  Brienne Liguori B WHNP-BC, 3:12 PM 08/13/2023

## 2023-09-04 DIAGNOSIS — H5000 Unspecified esotropia: Secondary | ICD-10-CM | POA: Diagnosis not present

## 2023-09-04 DIAGNOSIS — H5203 Hypermetropia, bilateral: Secondary | ICD-10-CM | POA: Diagnosis not present

## 2023-09-04 DIAGNOSIS — H10413 Chronic giant papillary conjunctivitis, bilateral: Secondary | ICD-10-CM | POA: Diagnosis not present

## 2023-10-16 DIAGNOSIS — F331 Major depressive disorder, recurrent, moderate: Secondary | ICD-10-CM | POA: Diagnosis not present

## 2023-10-16 DIAGNOSIS — F411 Generalized anxiety disorder: Secondary | ICD-10-CM | POA: Diagnosis not present

## 2023-10-16 DIAGNOSIS — G47419 Narcolepsy without cataplexy: Secondary | ICD-10-CM | POA: Diagnosis not present

## 2023-10-21 DIAGNOSIS — E559 Vitamin D deficiency, unspecified: Secondary | ICD-10-CM | POA: Diagnosis not present

## 2023-10-21 DIAGNOSIS — E785 Hyperlipidemia, unspecified: Secondary | ICD-10-CM | POA: Diagnosis not present

## 2023-10-21 LAB — LAB REPORT - SCANNED: EGFR (Non-African Amer.): 74.7

## 2023-10-25 DIAGNOSIS — Z1331 Encounter for screening for depression: Secondary | ICD-10-CM | POA: Diagnosis not present

## 2023-10-25 DIAGNOSIS — Z1339 Encounter for screening examination for other mental health and behavioral disorders: Secondary | ICD-10-CM | POA: Diagnosis not present

## 2023-10-25 DIAGNOSIS — G4700 Insomnia, unspecified: Secondary | ICD-10-CM | POA: Diagnosis not present

## 2023-10-25 DIAGNOSIS — Z0000 Encounter for general adult medical examination without abnormal findings: Secondary | ICD-10-CM | POA: Diagnosis not present

## 2023-11-15 ENCOUNTER — Other Ambulatory Visit: Payer: Self-pay | Admitting: Radiology

## 2023-11-15 DIAGNOSIS — Z1231 Encounter for screening mammogram for malignant neoplasm of breast: Secondary | ICD-10-CM

## 2023-11-27 DIAGNOSIS — E785 Hyperlipidemia, unspecified: Secondary | ICD-10-CM | POA: Diagnosis not present

## 2023-12-04 ENCOUNTER — Ambulatory Visit
Admission: RE | Admit: 2023-12-04 | Discharge: 2023-12-04 | Disposition: A | Discharge status: Home / Self Care | Source: Ambulatory Visit | Attending: Radiology | Admitting: Radiology

## 2023-12-04 DIAGNOSIS — Z1231 Encounter for screening mammogram for malignant neoplasm of breast: Secondary | ICD-10-CM | POA: Diagnosis not present

## 2023-12-09 ENCOUNTER — Other Ambulatory Visit: Payer: Self-pay | Admitting: Radiology

## 2023-12-09 DIAGNOSIS — R928 Other abnormal and inconclusive findings on diagnostic imaging of breast: Secondary | ICD-10-CM

## 2023-12-19 ENCOUNTER — Ambulatory Visit
Admission: RE | Admit: 2023-12-19 | Discharge: 2023-12-19 | Disposition: A | Discharge status: Home / Self Care | Source: Ambulatory Visit | Attending: Radiology | Admitting: Radiology

## 2023-12-19 DIAGNOSIS — R928 Other abnormal and inconclusive findings on diagnostic imaging of breast: Secondary | ICD-10-CM

## 2024-01-13 DIAGNOSIS — Z780 Asymptomatic menopausal state: Secondary | ICD-10-CM | POA: Diagnosis not present

## 2024-01-14 DIAGNOSIS — G47419 Narcolepsy without cataplexy: Secondary | ICD-10-CM | POA: Diagnosis not present

## 2024-01-14 DIAGNOSIS — F331 Major depressive disorder, recurrent, moderate: Secondary | ICD-10-CM | POA: Diagnosis not present

## 2024-01-14 DIAGNOSIS — F411 Generalized anxiety disorder: Secondary | ICD-10-CM | POA: Diagnosis not present

## 2024-03-02 ENCOUNTER — Other Ambulatory Visit: Payer: Self-pay | Admitting: Radiology

## 2024-03-02 DIAGNOSIS — N958 Other specified menopausal and perimenopausal disorders: Secondary | ICD-10-CM

## 2024-03-02 NOTE — Telephone Encounter (Signed)
.  Med refill request: Imvexxy  maintenance pack 10 mcg insert  Last AEX: 05/15/23 Next OV: 08/13/23 Next AEX: 05/19/24 Last MMG (if hormonal med) 12/19/23 BI-Rads 1 Neg.  Refill authorized: Please Advise?

## 2024-04-09 ENCOUNTER — Encounter (HOSPITAL_BASED_OUTPATIENT_CLINIC_OR_DEPARTMENT_OTHER): Payer: Self-pay | Admitting: Internal Medicine

## 2024-04-09 ENCOUNTER — Ambulatory Visit (HOSPITAL_BASED_OUTPATIENT_CLINIC_OR_DEPARTMENT_OTHER): Admitting: Internal Medicine

## 2024-04-09 VITALS — BP 104/54 | HR 68 | Ht 64.5 in | Wt 143.2 lb

## 2024-04-09 DIAGNOSIS — E785 Hyperlipidemia, unspecified: Secondary | ICD-10-CM

## 2024-04-09 DIAGNOSIS — E7841 Elevated Lipoprotein(a): Secondary | ICD-10-CM | POA: Diagnosis not present

## 2024-04-09 NOTE — Patient Instructions (Signed)
 Medication Instructions:   Your physician recommends that you continue on your current medications as directed. Please refer to the Current Medication list given to you today.  *If you need a refill on your cardiac medications before your next appointment, please call your pharmacy*    Testing/Procedures:  Genetic test for E78.41 ordered ([facility]) Cheek swab completed in office Specimen and necessary paperwork mailed. ID: [id number]    Follow-Up:  TBD WITH DR. HILTY IN LIPID CLINIC PENDING GENETIC TESTING RESULTS

## 2024-04-09 NOTE — Progress Notes (Signed)
 LIPID CLINIC CONSULT NOTE  Chief Complaint:  Manage dyslipidemia  Primary Care Physician: Valentin Skates, DO  Primary Cardiologist:  None  HPI:  Whitney Guzman is a 53 y.o. female who is being seen today for the evaluation of dyslipidemia at the request of Valentin Skates, DO. This is a pleasant 53 year old female who is referred for evaluation and management of dyslipidemia.  She has a history of high cholesterol in the past with recent total cholesterol 265, triglycerides 135, HDL 70 and LDL 168.  She also had testing for LP(a) which is elevated 134 nmol/L but also underwent a calcium score which was 0.  She has not yet been on any lipid lowering therapies.  She did have COVID-19 and experienced brain fog for period of time after that but has since recovered.  She generally eats at home and tries to reduce saturated fats in her diet and exercise regularly.  PMHx:  Past Medical History:  Diagnosis Date   Anxiety    on meds   Chronic lower back pain    Depression    on meds   Endometriosis    GERD (gastroesophageal reflux disease)    hx of   Migraine without aura     Past Surgical History:  Procedure Laterality Date   APPENDECTOMY and treatment of endometriosis  2005   STRABISMUS SURGERY  1975   TMJ ARTHROSCOPY  1995   WISDOM TOOTH EXTRACTION      FAMHx:  Family History  Problem Relation Age of Onset   Dementia Mother        vascular dementia   Heart failure Mother    Stroke Mother    Depression Father    Anxiety disorder Father    Lung cancer Father 37   Melanoma Father 74   Anxiety disorder Sister    Breast cancer Sister 83   Cancer Sister 62       melanoma   Colon cancer Maternal Grandmother 54   Colon polyps Maternal Grandmother    Anxiety disorder Maternal Grandfather    Esophageal cancer Neg Hx    Stomach cancer Neg Hx    Rectal cancer Neg Hx     SOCHx:   reports that she has never smoked. She has been exposed to tobacco smoke. She has never  used smokeless tobacco. She reports current alcohol  use. She reports that she does not use drugs.  ALLERGIES:  Allergies  Allergen Reactions   Bee Venom     ROS: Pertinent items noted in HPI and remainder of comprehensive ROS otherwise negative.  HOME MEDS: Current Outpatient Medications on File Prior to Visit  Medication Sig Dispense Refill   ALPRAZolam (XANAX) 0.25 MG tablet TK 1-2 TS PO QHS  3   Armodafinil 200 MG TABS Take 1 tablet by mouth daily.     escitalopram (LEXAPRO) 20 MG tablet Take 20 mg by mouth daily.     Estradiol  (IMVEXXY  MAINTENANCE PACK) 10 MCG INST PLACE ONE (1) TABLET VAGINALLY TWO (2) (TWO) TIMES A WEEK. 8 each 11   estradiol  (VIVELLE -DOT) 0.025 MG/24HR Place 1 patch onto the skin 2 (two) times a week. 24 patch 4   ibuprofen (ADVIL) 200 MG tablet Take 200 mg by mouth every 6 (six) hours as needed.     lamoTRIgine (LAMICTAL) 150 MG tablet Take 150 mg by mouth daily.     progesterone  (PROMETRIUM ) 100 MG capsule Take 1 capsule (100 mg total) by mouth daily. 90 capsule 4   zolpidem (  AMBIEN) 10 MG tablet Take 10 mg by mouth at bedtime as needed for sleep.     clotrimazole-betamethasone  (LOTRISONE) cream Apply 1 application topically as needed. (Patient not taking: Reported on 04/09/2024)     No current facility-administered medications on file prior to visit.    LABS/IMAGING: No results found for this or any previous visit (from the past 48 hours). No results found.  LIPID PANEL:    Component Value Date/Time   CHOL 251 (H) 04/11/2022 1250   CHOL 230 (H) 02/17/2020 1424   TRIG 76 04/11/2022 1250   HDL 74 04/11/2022 1250   HDL 60 02/17/2020 1424   CHOLHDL 3.4 04/11/2022 1250   VLDL 17 11/21/2016 1344   LDLCALC 159 (H) 04/11/2022 1250    No results found for: LIPOA   WEIGHTS: Wt Readings from Last 3 Encounters:  04/09/24 143 lb 3.2 oz (65 kg)  08/13/23 138 lb (62.6 kg)  05/15/23 135 lb (61.2 kg)    VITALS: BP (!) 104/54 (BP Location: Right Arm,  Patient Position: Sitting, Cuff Size: Normal)   Pulse 68   Ht 5' 4.5 (1.638 m)   Wt 143 lb 3.2 oz (65 kg)   LMP 08/12/2021 (Approximate)   SpO2 98%   BMI 24.20 kg/m   EXAM: Deferred  EKG: Deferred  ASSESSMENT: Dyslipidemia, goal LDL less than 70 Elevated OE(j)-865 nmol/L 0 CAC score (11/2023)  PLAN: 1.   Whitney Guzman has a dyslipidemia with target LDL less than 70 due to elevated LP(a) which is a significant cardiac risk factor.  Based on that, would recommend lipid-lowering therapy to further improve her cholesterol.  She is interested however in genetic testing to further understand her risk before deciding on therapeutic agents.  We discussed genetic testing today and she is agreeable to the procedure.  Will contact her with those results in a few weeks when they are available through MyChart.  Typically this is covered by insurance but if not would lead to an $299 charge.  Thanks as always for the kind referral.  Vinie KYM Maxcy, MD, [facility] FNLA, FACP  [place]  [facility]  Medical Director of the [facility]  [facility] Diplomate of the American Board of Clinical Lipidology Attending Cardiologist  Direct Dial: [phone]  Fax: [phone]  Website:  [email or web address]  Vinie JAYSON Maxcy 04/09/2024, 2:36 PM

## 2024-04-27 DIAGNOSIS — L821 Other seborrheic keratosis: Secondary | ICD-10-CM | POA: Diagnosis not present

## 2024-04-27 DIAGNOSIS — L738 Other specified follicular disorders: Secondary | ICD-10-CM | POA: Diagnosis not present

## 2024-04-27 DIAGNOSIS — L814 Other melanin hyperpigmentation: Secondary | ICD-10-CM | POA: Diagnosis not present

## 2024-04-27 DIAGNOSIS — D1801 Hemangioma of skin and subcutaneous tissue: Secondary | ICD-10-CM | POA: Diagnosis not present

## 2024-04-29 DIAGNOSIS — F331 Major depressive disorder, recurrent, moderate: Secondary | ICD-10-CM | POA: Diagnosis not present

## 2024-04-29 DIAGNOSIS — G47419 Narcolepsy without cataplexy: Secondary | ICD-10-CM | POA: Diagnosis not present

## 2024-04-29 DIAGNOSIS — F411 Generalized anxiety disorder: Secondary | ICD-10-CM | POA: Diagnosis not present

## 2024-05-03 ENCOUNTER — Encounter (HOSPITAL_BASED_OUTPATIENT_CLINIC_OR_DEPARTMENT_OTHER): Payer: Self-pay | Admitting: Internal Medicine

## 2024-05-03 DIAGNOSIS — E785 Hyperlipidemia, unspecified: Secondary | ICD-10-CM

## 2024-05-03 DIAGNOSIS — E7841 Elevated Lipoprotein(a): Secondary | ICD-10-CM

## 2024-05-04 MED ORDER — ROSUVASTATIN CALCIUM 20 MG PO TABS
20.0000 mg | ORAL_TABLET | Freq: Every day | ORAL | 3 refills | Status: AC
Start: 2024-05-04 — End: 2024-08-02

## 2024-05-19 ENCOUNTER — Other Ambulatory Visit (HOSPITAL_COMMUNITY)
Admission: RE | Admit: 2024-05-19 | Discharge: 2024-05-19 | Disposition: A | Discharge status: Home / Self Care | Source: Ambulatory Visit | Attending: Radiology | Admitting: Radiology

## 2024-05-19 ENCOUNTER — Ambulatory Visit: Payer: BC Managed Care – PPO | Admitting: Radiology

## 2024-05-19 ENCOUNTER — Encounter: Payer: Self-pay | Admitting: Radiology

## 2024-05-19 VITALS — BP 116/76 | HR 62 | Ht 64.5 in | Wt 137.0 lb

## 2024-05-19 DIAGNOSIS — N3944 Nocturnal enuresis: Secondary | ICD-10-CM | POA: Diagnosis not present

## 2024-05-19 DIAGNOSIS — Z01419 Encounter for gynecological examination (general) (routine) without abnormal findings: Secondary | ICD-10-CM

## 2024-05-19 DIAGNOSIS — Z1331 Encounter for screening for depression: Secondary | ICD-10-CM | POA: Diagnosis not present

## 2024-05-19 DIAGNOSIS — N959 Unspecified menopausal and perimenopausal disorder: Secondary | ICD-10-CM

## 2024-05-19 DIAGNOSIS — M8580 Other specified disorders of bone density and structure, unspecified site: Secondary | ICD-10-CM | POA: Diagnosis not present

## 2024-05-19 DIAGNOSIS — N958 Other specified menopausal and perimenopausal disorders: Secondary | ICD-10-CM

## 2024-05-19 MED ORDER — PROGESTERONE MICRONIZED 100 MG PO CAPS: 200.0000 mg | ORAL_CAPSULE | Freq: Every evening | ORAL | 4 refills | Status: AC

## 2024-05-19 MED ORDER — IMVEXXY MAINTENANCE PACK 10 MCG VA INST: 1.0000 | VAGINAL_INSERT | VAGINAL | 11 refills | Status: AC

## 2024-05-19 MED ORDER — ESTRADIOL 0.05 MG/24HR TD PTTW: 1.0000 | MEDICATED_PATCH | TRANSDERMAL | 4 refills | Status: AC

## 2024-05-19 NOTE — Patient Instructions (Signed)
 Preventive Care 54-54 Years Old, Female  Preventive care refers to lifestyle choices and visits with your health care provider that can promote health and wellness. Preventive care visits are also called wellness exams.  What can I expect for my preventive care visit?  Counseling  Your health care provider may ask you questions about your:  Medical history, including:  Past medical problems.  Family medical history.  Pregnancy history.  Current health, including:  Menstrual cycle.  Method of birth control.  Emotional well-being.  Home life and relationship well-being.  Sexual activity and sexual health.  Lifestyle, including:  Alcohol, nicotine or tobacco, and drug use.  Access to firearms.  Diet, exercise, and sleep habits.  Work and work Astronomer.  Sunscreen use.  Safety issues such as seatbelt and bike helmet use.  Physical exam  Your health care provider will check your:  Height and weight. These may be used to calculate your BMI (body mass index). BMI is a measurement that tells if you are at a healthy weight.  Waist circumference. This measures the distance around your waistline. This measurement also tells if you are at a healthy weight and may help predict your risk of certain diseases, such as type 2 diabetes and high blood pressure.  Heart rate and blood pressure.  Body temperature.  Skin for abnormal spots.  What immunizations do I need?    Vaccines are usually given at various ages, according to a schedule. Your health care provider will recommend vaccines for you based on your age, medical history, and lifestyle or other factors, such as travel or where you work.  What tests do I need?  Screening  Your health care provider may recommend screening tests for certain conditions. This may include:  Lipid and cholesterol levels.  Diabetes screening. This is done by checking your blood sugar (glucose) after you have not eaten for a while (fasting).  Pelvic exam and Pap test.  Hepatitis B test.  Hepatitis C  test.  HIV (human immunodeficiency virus) test.  STI (sexually transmitted infection) testing, if you are at risk.  Lung cancer screening.  Colorectal cancer screening.  Mammogram. Talk with your health care provider about when you should start having regular mammograms. This may depend on whether you have a family history of breast cancer.  BRCA-related cancer screening. This may be done if you have a family history of breast, ovarian, tubal, or peritoneal cancers.  Bone density scan. This is done to screen for osteoporosis.  Talk with your health care provider about your test results, treatment options, and if necessary, the need for more tests.  Follow these instructions at home:  Eating and drinking    Eat a diet that includes fresh fruits and vegetables, whole grains, lean protein, and low-fat dairy products.  Take vitamin and mineral supplements as recommended by your health care provider.  Do not drink alcohol if:  Your health care provider tells you not to drink.  You are pregnant, may be pregnant, or are planning to become pregnant.  If you drink alcohol:  Limit how much you have to 0-1 drink a day.  Know how much alcohol is in your drink. In the [place] one drink equals one 12 oz bottle of beer (355 mL), one 5 oz glass of wine (148 mL), or one 1 oz glass of hard liquor (44 mL).  Lifestyle  Brush your teeth every morning and night with fluoride toothpaste. Floss one time each day.  Exercise for at least  30 minutes 5 or more days each week.  Do not use any products that contain nicotine or tobacco. These products include cigarettes, chewing tobacco, and vaping devices, such as e-cigarettes. If you need help quitting, ask your health care provider.  Do not use drugs.  If you are sexually active, practice safe sex. Use a condom or other form of protection to prevent STIs.  If you do not wish to become pregnant, use a form of birth control. If you plan to become pregnant, see your health care provider for a  prepregnancy visit.  Take aspirin only as told by your health care provider. Make sure that you understand how much to take and what form to take. Work with your health care provider to find out whether it is safe and beneficial for you to take aspirin daily.  Find healthy ways to manage stress, such as:  Meditation, yoga, or listening to music.  Journaling.  Talking to a trusted person.  Spending time with friends and family.  Minimize exposure to UV radiation to reduce your risk of skin cancer.  Safety  Always wear your seat belt while driving or riding in a vehicle.  Do not drive:  If you have been drinking alcohol. Do not ride with someone who has been drinking.  When you are tired or distracted.  While texting.  If you have been using any mind-altering substances or drugs.  Wear a helmet and other protective equipment during sports activities.  If you have firearms in your house, make sure you follow all gun safety procedures.  Seek help if you have been physically or sexually abused.  What's next?  Visit your health care provider once a year for an annual wellness visit.  Ask your health care provider how often you should have your eyes and teeth checked.  Stay up to date on all vaccines.  This information is not intended to replace advice given to you by your health care provider. Make sure you discuss any questions you have with your health care provider.  Document Revised: 01/25/2021 Document Reviewed: 01/25/2021  Elsevier Patient Education  2024 [employer].

## 2024-05-19 NOTE — Progress Notes (Signed)
 Whitney Guzman 04-May-1971 [id number]   History: Postmenopausal 53 y.o. presents for annual exam. On HRT to manage menopausal symptoms, estradiol  0.025 mg patch, progesterone  100 mg, estradiol  10 mcg insert. Diagnosed with osteopenia, still having trouble sleeping. Would like to increase doses. She has had some urinary incontinence at night, has to wear a pad or her underwear are damp.  Gynecologic History Postmenopausal Last Pap: 2022. Results were: normal Last mammogram: 12/2023. Results were: normal Last colonoscopy: 2021 DEXA: 2025 osteopenia  Obstetric History OB History  Gravida Para Term Preterm AB Living  1 1 1   1   SAB IAB Ectopic Multiple Live Births          # Outcome Date GA Lbr Len/2nd Weight Sex Type Anes PTL Lv  1 Term               05/19/2024    2:44 PM  Depression screen PHQ 2/9  Decreased Interest 0  Down, Depressed, Hopeless 0  PHQ - 2 Score 0     The following portions of the patient's history were reviewed and updated as appropriate: allergies, current medications, past family history, past medical history, past social history, past surgical history, and problem list.  Review of Systems Pertinent items noted in HPI and remainder of comprehensive ROS otherwise negative.  Past medical history, past surgical history, family history and social history were all reviewed and documented in the EPIC chart.  Exam:  Vitals:   05/19/24 1438  BP: 116/76  Pulse: 62  SpO2: 98%  Weight: 137 lb (62.1 kg)  Height: 5' 4.5 (1.638 m)    Body mass index is 23.15 kg/m.  General appearance:  Normal Thyroid :  Symmetrical, normal in size, without palpable masses or nodularity. Respiratory  Auscultation:  Clear without wheezing or rhonchi Cardiovascular  Auscultation:  Regular rate, without rubs, murmurs or gallops  Edema/varicosities:  Not grossly evident Abdominal  Soft,nontender, without masses, guarding or rebound.  Liver/spleen:  No organomegaly  noted  Hernia:  None appreciated  Skin  Inspection:  Grossly normal Breasts: Examined lying and sitting.   Right: Without masses, retractions, nipple discharge or axillary adenopathy.   Left: Without masses, retractions, nipple discharge or axillary adenopathy. Genitourinary   Inguinal/mons:  Normal without inguinal adenopathy  External genitalia:  Normal appearing vulva with no masses, tenderness, or lesions  BUS/Urethra/Skene's glands:  Normal  Vagina:  Normal appearing with normal color and discharge, no lesions. Atrophy: mild- improved. Poor vaginal tone.  Cervix:  Normal appearing without discharge or lesions  Uterus:  Normal in size, shape and contour.  Midline and mobile, nontender  Adnexa/parametria:     Rt: Normal in size, without masses or tenderness.   Lt: Normal in size, without masses or tenderness.  Anus and perineum: Normal    Dereck Keas, CMA present for exam  Assessment/Plan:   1. Well woman exam with routine gynecological exam (Primary) - Cytology - PAP( [place])  2. Menopausal disorder - progesterone  (PROMETRIUM ) 100 MG capsule; Take 2 capsules (200 mg total) by mouth at bedtime.  Dispense: 180 capsule; Refill: 4 - estradiol  (VIVELLE -DOT) 0.05 MG/24HR patch; Place 1 patch (0.05 mg total) onto the skin 2 (two) times a week.  Dispense: 24 patch; Refill: 4  3. Genitourinary syndrome of menopause - Estradiol  (IMVEXXY  MAINTENANCE PACK) 10 MCG INST; Place 1 capsule vaginally 2 (two) times a week.  Dispense: 8 each; Refill: 11  4. Osteopenia after menopause - estradiol  (VIVELLE -DOT) 0.05 MG/24HR patch; Place 1 patch (  0.05 mg total) onto the skin 2 (two) times a week.  Dispense: 24 patch; Refill: 4  5. Depression screen  6. Nocturnal enuresis Continue vaginal estrogen Kegel exercises Strengthen core   Follow up 1 year for AEX  Tonia Avino B WHNP-BC, 3:08 PM 05/19/2024

## 2024-05-20 ENCOUNTER — Ambulatory Visit: Payer: Self-pay | Admitting: Radiology

## 2024-05-20 DIAGNOSIS — Z23 Encounter for immunization: Secondary | ICD-10-CM | POA: Diagnosis not present

## 2024-05-20 DIAGNOSIS — G43009 Migraine without aura, not intractable, without status migrainosus: Secondary | ICD-10-CM | POA: Diagnosis not present

## 2024-05-20 LAB — CYTOLOGY - PAP
Comment: NEGATIVE
Diagnosis: NEGATIVE
High risk HPV: NEGATIVE

## 2024-06-10 DIAGNOSIS — F331 Major depressive disorder, recurrent, moderate: Secondary | ICD-10-CM | POA: Diagnosis not present

## 2024-06-10 DIAGNOSIS — F411 Generalized anxiety disorder: Secondary | ICD-10-CM | POA: Diagnosis not present

## 2024-06-10 DIAGNOSIS — G47419 Narcolepsy without cataplexy: Secondary | ICD-10-CM | POA: Diagnosis not present

## 2025-05-20 ENCOUNTER — Ambulatory Visit: Admitting: Radiology
